# Patient Record
Sex: Male | Born: 1960 | Race: White | Hispanic: No | Marital: Single | Smoking: Current every day smoker
Health system: Southern US, Community
[De-identification: ages and names within clinical notes are randomized; demographics above are authoritative.]

## PROBLEM LIST (undated history)

## (undated) DIAGNOSIS — F3130 Bipolar disorder, current episode depressed, mild or moderate severity, unspecified: Secondary | ICD-10-CM

## (undated) DIAGNOSIS — E119 Type 2 diabetes mellitus without complications: Secondary | ICD-10-CM

## (undated) DIAGNOSIS — R338 Other retention of urine: Secondary | ICD-10-CM

## (undated) DIAGNOSIS — F250 Schizoaffective disorder, bipolar type: Secondary | ICD-10-CM

## (undated) DIAGNOSIS — I7300 Raynaud's syndrome without gangrene: Secondary | ICD-10-CM

## (undated) DIAGNOSIS — M170 Bilateral primary osteoarthritis of knee: Secondary | ICD-10-CM

## (undated) DIAGNOSIS — N401 Enlarged prostate with lower urinary tract symptoms: Secondary | ICD-10-CM

## (undated) DIAGNOSIS — G5603 Carpal tunnel syndrome, bilateral upper limbs: Secondary | ICD-10-CM

## (undated) DIAGNOSIS — E079 Disorder of thyroid, unspecified: Secondary | ICD-10-CM

## (undated) DIAGNOSIS — E669 Obesity, unspecified: Secondary | ICD-10-CM

## (undated) HISTORY — DX: Schizoaffective disorder, bipolar type: F25.0

## (undated) HISTORY — DX: Carpal tunnel syndrome, bilateral upper limbs: G56.03

## (undated) HISTORY — DX: Obesity, unspecified: E66.9

## (undated) HISTORY — DX: Disorder of thyroid, unspecified: E07.9

## (undated) HISTORY — DX: Bipolar disorder, current episode depressed, mild or moderate severity, unspecified: F31.30

## (undated) HISTORY — PX: LEG SURGERY: SHX1003

## (undated) HISTORY — DX: Type 2 diabetes mellitus without complications: E11.9

## (undated) HISTORY — DX: Benign prostatic hyperplasia with lower urinary tract symptoms: N40.1

## (undated) HISTORY — DX: Raynaud's syndrome without gangrene: I73.00

## (undated) HISTORY — DX: Bilateral primary osteoarthritis of knee: M17.0

## (undated) HISTORY — DX: Other retention of urine: R33.8

---

## 2011-01-28 ENCOUNTER — Emergency Department (HOSPITAL_COMMUNITY)
Admission: EM | Admit: 2011-01-28 | Discharge: 2011-01-28 | Disposition: A | Discharge status: Home / Self Care | Payer: No Typology Code available for payment source | Attending: Emergency Medicine | Admitting: Emergency Medicine

## 2011-01-28 ENCOUNTER — Emergency Department (HOSPITAL_COMMUNITY): Payer: No Typology Code available for payment source

## 2011-01-28 DIAGNOSIS — M542 Cervicalgia: Secondary | ICD-10-CM | POA: Insufficient documentation

## 2011-01-28 DIAGNOSIS — M549 Dorsalgia, unspecified: Secondary | ICD-10-CM | POA: Insufficient documentation

## 2011-01-28 DIAGNOSIS — S139XXA Sprain of joints and ligaments of unspecified parts of neck, initial encounter: Secondary | ICD-10-CM | POA: Insufficient documentation

## 2011-01-28 DIAGNOSIS — M546 Pain in thoracic spine: Secondary | ICD-10-CM | POA: Insufficient documentation

## 2011-01-28 DIAGNOSIS — M25519 Pain in unspecified shoulder: Secondary | ICD-10-CM | POA: Insufficient documentation

## 2011-01-28 DIAGNOSIS — IMO0002 Reserved for concepts with insufficient information to code with codable children: Secondary | ICD-10-CM | POA: Insufficient documentation

## 2011-07-28 ENCOUNTER — Inpatient Hospital Stay (HOSPITAL_COMMUNITY)
Admission: RE | Admit: 2011-07-28 | Discharge: 2011-07-28 | Disposition: A | Discharge status: Home / Self Care | Payer: BC Managed Care – PPO | Source: Ambulatory Visit | Attending: Family Medicine | Admitting: Family Medicine

## 2011-10-06 DIAGNOSIS — G5603 Carpal tunnel syndrome, bilateral upper limbs: Secondary | ICD-10-CM

## 2011-10-06 DIAGNOSIS — N401 Enlarged prostate with lower urinary tract symptoms: Secondary | ICD-10-CM

## 2011-10-06 DIAGNOSIS — I7300 Raynaud's syndrome without gangrene: Secondary | ICD-10-CM

## 2011-10-06 HISTORY — DX: Carpal tunnel syndrome, bilateral upper limbs: G56.03

## 2011-10-06 HISTORY — DX: Raynaud's syndrome without gangrene: I73.00

## 2011-10-06 HISTORY — DX: Benign prostatic hyperplasia with lower urinary tract symptoms: N40.1

## 2011-11-30 ENCOUNTER — Encounter (HOSPITAL_COMMUNITY): Payer: Self-pay | Admitting: *Deleted

## 2011-11-30 ENCOUNTER — Emergency Department (HOSPITAL_COMMUNITY): Payer: BC Managed Care – PPO

## 2011-11-30 ENCOUNTER — Emergency Department (HOSPITAL_COMMUNITY)
Admission: EM | Admit: 2011-11-30 | Discharge: 2011-11-30 | Disposition: A | Discharge status: Home / Self Care | Payer: BC Managed Care – PPO | Attending: Emergency Medicine | Admitting: Emergency Medicine

## 2011-11-30 DIAGNOSIS — I7300 Raynaud's syndrome without gangrene: Secondary | ICD-10-CM | POA: Insufficient documentation

## 2011-11-30 DIAGNOSIS — M7989 Other specified soft tissue disorders: Secondary | ICD-10-CM | POA: Insufficient documentation

## 2011-11-30 DIAGNOSIS — M79609 Pain in unspecified limb: Secondary | ICD-10-CM | POA: Insufficient documentation

## 2011-11-30 MED ORDER — CEPHALEXIN 500 MG PO CAPS: 500.0000 mg | ORAL_CAPSULE | Freq: Four times a day (QID) | ORAL | Status: AC

## 2011-11-30 MED ORDER — OXYCODONE-ACETAMINOPHEN 5-325 MG PO TABS: 1.0000 | ORAL_TABLET | Freq: Four times a day (QID) | ORAL | Status: AC | PRN

## 2011-11-30 NOTE — ED Notes (Signed)
Family at bedside. 

## 2011-11-30 NOTE — ED Notes (Signed)
Patient has noted his finger tips are white colored and his hands are swollen for 10 days.  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  Patient states has pain in both hands.  He states he has intermittent color changes.

## 2011-11-30 NOTE — ED Notes (Signed)
C/o bilateral hand edema, tingling, pain x 1 week. Denies injury. States when its cold outside his fingertips turn white in color

## 2011-11-30 NOTE — ED Notes (Signed)
Patient transported to X-ray 

## 2011-11-30 NOTE — ED Provider Notes (Signed)
History     CSN: [id number]  Arrival date & time 11/30/11  1146   First MD Initiated Contact with Patient 11/30/11 1331      Chief Complaint  Patient presents with  . Hand Problem    (Consider location/radiation/quality/duration/timing/severity/associated sxs/prior treatment) HPI Comments: Pain is an 8/10 in bilateral hands. Fingers are red and swollen.  Patient is a 51 y.o. male presenting with hand pain. The history is provided by the patient.  Hand Pain This is a new problem. The current episode started more than 1 week ago. The problem occurs constantly. The problem has been gradually improving. Associated symptoms comments: Over the last 2 weeks bilateral hands have been swollen and red. When they get cold fingertips turn white and as they warm up the hands become red again.. Exacerbated by: Cold-weather. Relieved by: Warming up the hand. He has tried nothing for the symptoms. The treatment provided no relief.    History reviewed. No pertinent past medical history.  Past Surgical History  Procedure Date  . Leg surgery     No family history on file.  History  Substance Use Topics  . Smoking status: Current Everyday Smoker  . Smokeless tobacco: Not on file  . Alcohol Use: Yes      Review of Systems  Constitutional: Negative for fever.  All other systems reviewed and are negative.    Allergies  Review of patient's allergies indicates no known allergies.  Home Medications  No current outpatient prescriptions on file.  BP 129/94  Pulse 81  Temp(Src) 97.9 F (36.6 C) (Oral)  Resp 20  Ht 5\' 9"  (1.753 m)  Wt 205 lb (92.987 kg)  BMI 30.27 kg/m2  SpO2 100%  Physical Exam  Nursing note and vitals reviewed. Constitutional: He is oriented to person, place, and time. He appears well-developed and well-nourished. No distress.  HENT:  Head: Normocephalic and atraumatic.  Mouth/Throat: Oropharynx is clear and moist.  Eyes: Conjunctivae and EOM are normal.  Pupils are equal, round, and reactive to light.  Neck: Normal range of motion. Neck supple.  Cardiovascular: Intact distal pulses.   Musculoskeletal: Normal range of motion. He exhibits no edema and no tenderness.       Left hand: He exhibits tenderness and swelling. He exhibits normal capillary refill.       Hands:      Mild redness and swelling in all fingers on bilateral hands. No warmth. Normal capillary refill. 2+ radial pulses bilaterally.  Neurological: He is alert and oriented to person, place, and time.  Skin: Skin is warm and dry. No rash noted. No erythema.  Psychiatric: He has a normal mood and affect. His behavior is normal.    ED Course  Procedures (including critical care time)  Labs Reviewed - No data to display Dg Hand Complete Right  11/30/2011  *RADIOLOGY REPORT*  Clinical Data: Pain.  Right thumb swelling.  Question foreign body of the thumb and fingers.  RIGHT HAND - COMPLETE 3+ VIEW  Comparison: None.  Findings: There is no evidence for acute fracture or dislocation. No soft tissue foreign body or gas identified.  IMPRESSION: Negative exam.  Original Report Authenticated By: Patterson Hammersmith, M.D.     No diagnosis found.    MDM   Patient with symptoms most consistent with Raynaud's. For the last 2 weeks he's had swelling of bilateral hands when his hands get cold they become white and then as they warm up they become red again. 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 However he states he's never had these symptoms before. On exam his fingers or mildly tender and swollen. The entire finger and hand is swollen not just joints. He has no other joint pain. No fever. He denies working with any material that he would be allergic to and no caustic materials. Patient does have one area over the thumb where it looks mildly infected.  He is unsure if he got steel in his hand or not. Will get a plain film to evaluate her for bodies. Discussed with patient at  length about Raynaud's in the lack of any particular treatment. Will treat his pain and have him wear gloves and other things to cause his hands to be worn at all times. We'll also give him anti-inflammatories. Will have him follow up with her regular doctor if he needs calcium channel blockers in the future. Also counseled about smoke cessation        Gwyneth Sprout, MD 11/30/11 1454

## 2011-11-30 NOTE — Discharge Instructions (Signed)
Raynaud's Syndrome Raynaud's Syndrome is a disorder of the blood vessels in your hands and feet. It occurs when small arteries of the arms/hands or legs/feet become sensitive to cold or emotional upset. This causes the arteries to constrict, or narrow, and reduces blood flow to the area. The color in the fingers or toes changes from white to bluish to red and this is not usually painful. There may be numbness and tingling. Sores on the skin (ulcers) can form. Symptoms are usually relieved by warming. HOME CARE INSTRUCTIONS   Avoid exposure to cold. Keep your whole body warm and dry. Dress in layers. Wear mittens or gloves when handling ice or frozen food and when outdoors. Use holders for glasses or cans containing cold drinks. If possible, stay indoors during cold weather.   Limit your use of caffeine. Switch to decaffeinated coffee, tea, and soda pop. Avoid chocolate.   Avoid smoking or being around cigarette smoke. Smoke will make symptoms worse.   Wear loose fitting socks and comfortable, roomy shoes.   Avoid vibrating tools and machinery.   If possible, avoid stressful and emotional situations. Exercise, meditation and yoga may help you cope with stress. Biofeedback may be useful.   Ask your caregiver about medicine (calcium channel blockers) that may control Raynaud's phenomena.  SEEK MEDICAL CARE IF:   Your discomfort becomes worse, despite conservative treatment.   You develop sores on your fingers and toes that do not heal.  Document Released: 09/18/2000 Document Revised: 06/03/2011 Document Reviewed: 09/25/2008 [facility] Patient Information 2012 [place], [place].  Insufficient Money for Medicine Contact [employer]:  call "211" or [facility] [phone].  No Primary Care Doctor Call [facility]  [phone] Other agencies that provide inexpensive medical care    [facility]  [phone]    [facility]  [phone]    [facility]   [phone]    [facility]  [phone]    [facility]  [phone]    [facility]  [phone]

## 2012-02-04 ENCOUNTER — Ambulatory Visit: Payer: BC Managed Care – PPO | Admitting: Family

## 2012-02-04 DIAGNOSIS — Z0289 Encounter for other administrative examinations: Secondary | ICD-10-CM

## 2012-10-05 DIAGNOSIS — F3130 Bipolar disorder, current episode depressed, mild or moderate severity, unspecified: Secondary | ICD-10-CM

## 2012-10-05 HISTORY — DX: Bipolar disorder, current episode depressed, mild or moderate severity, unspecified: F31.30

## 2013-07-07 IMAGING — CR DG HAND COMPLETE 3+V*R*
3 series · 3 of 3 positions shown · non-contrast
Comparison: None.

CLINICAL DATA: Pain.  Right thumb swelling.  Question foreign body
of the thumb and fingers.

RIGHT HAND - COMPLETE 3+ VIEW

[x hand pa right]
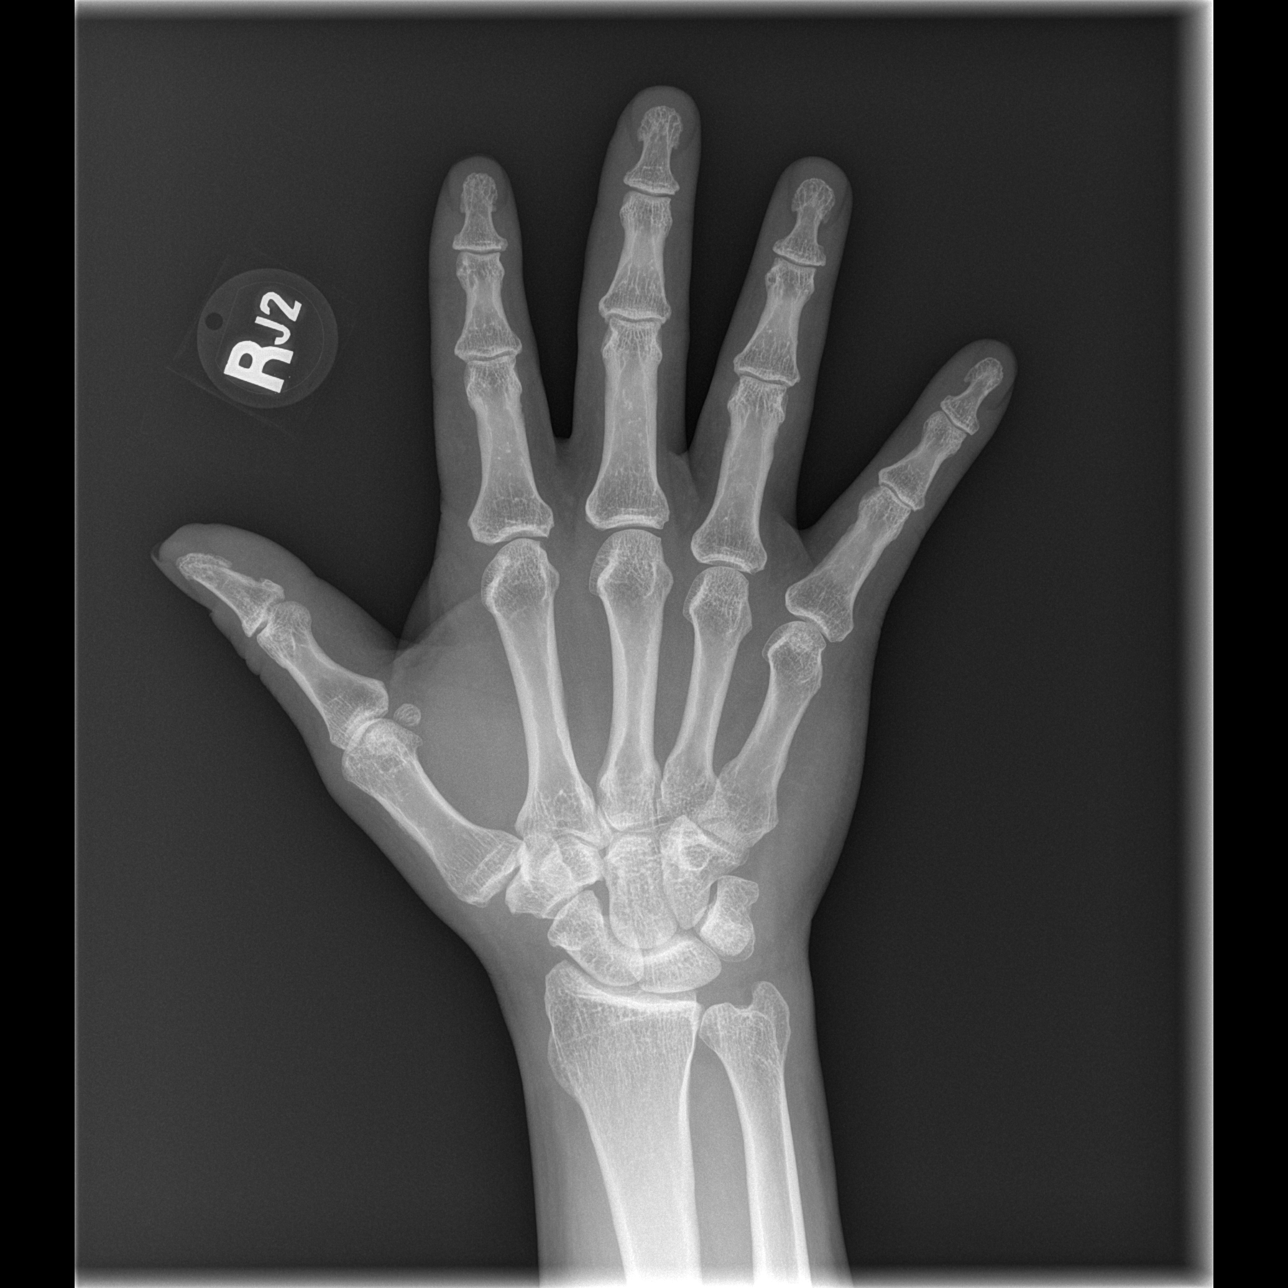

[x hand oblique right]
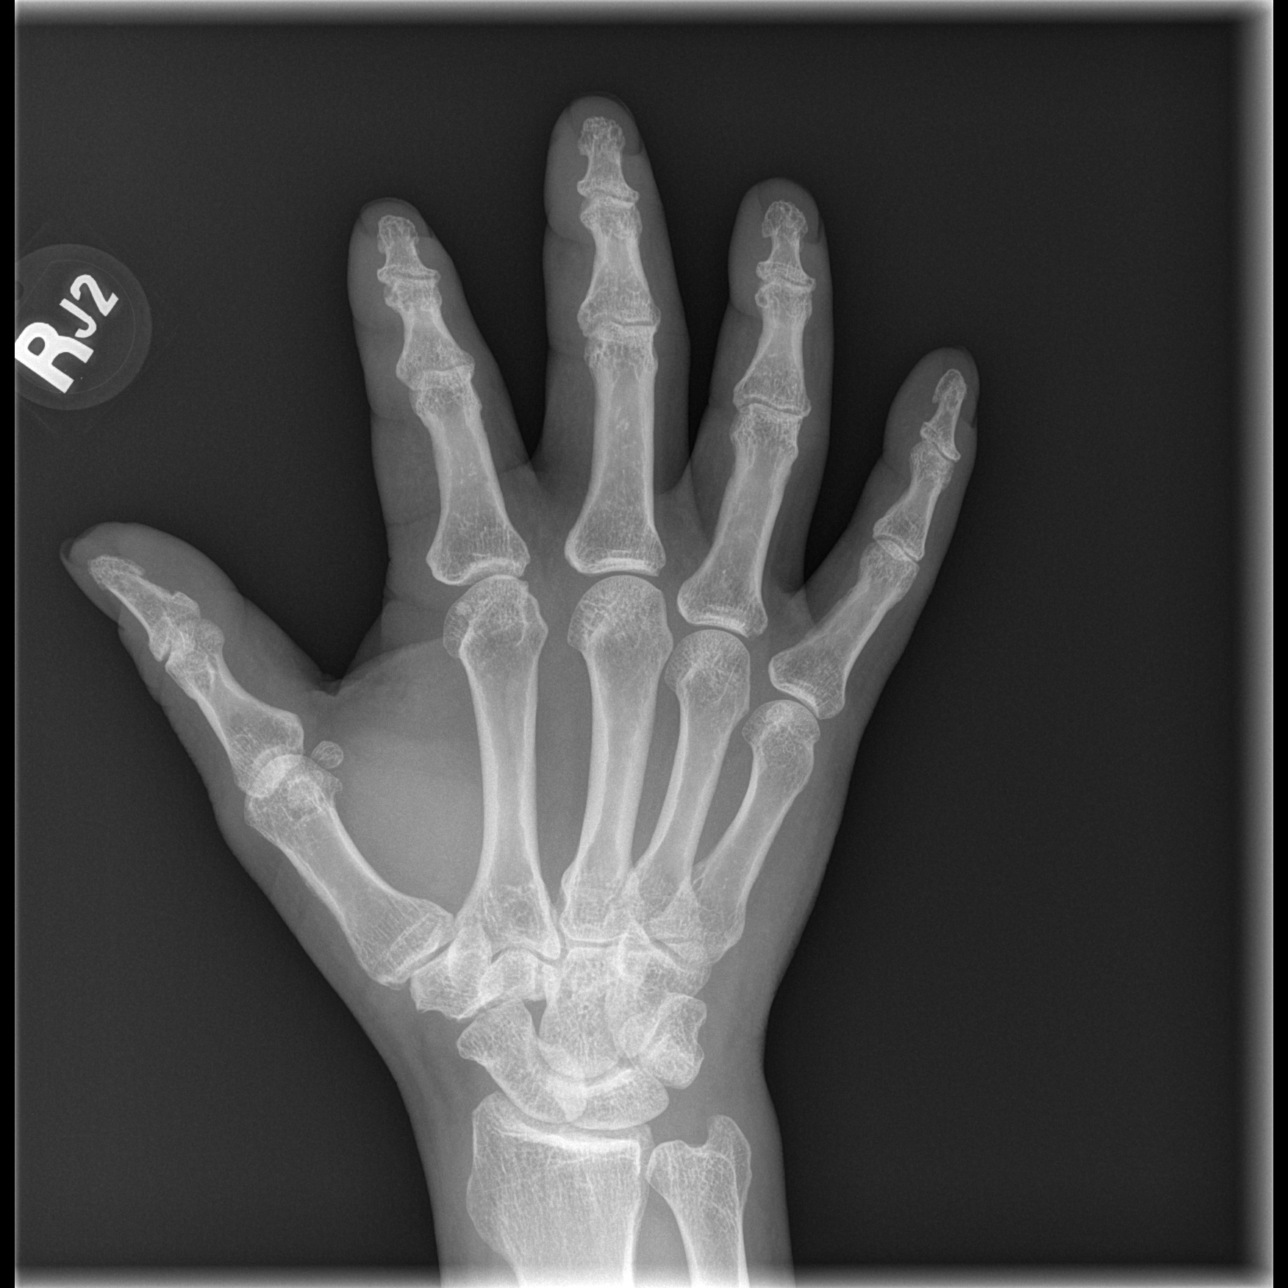

[x hand lat right]
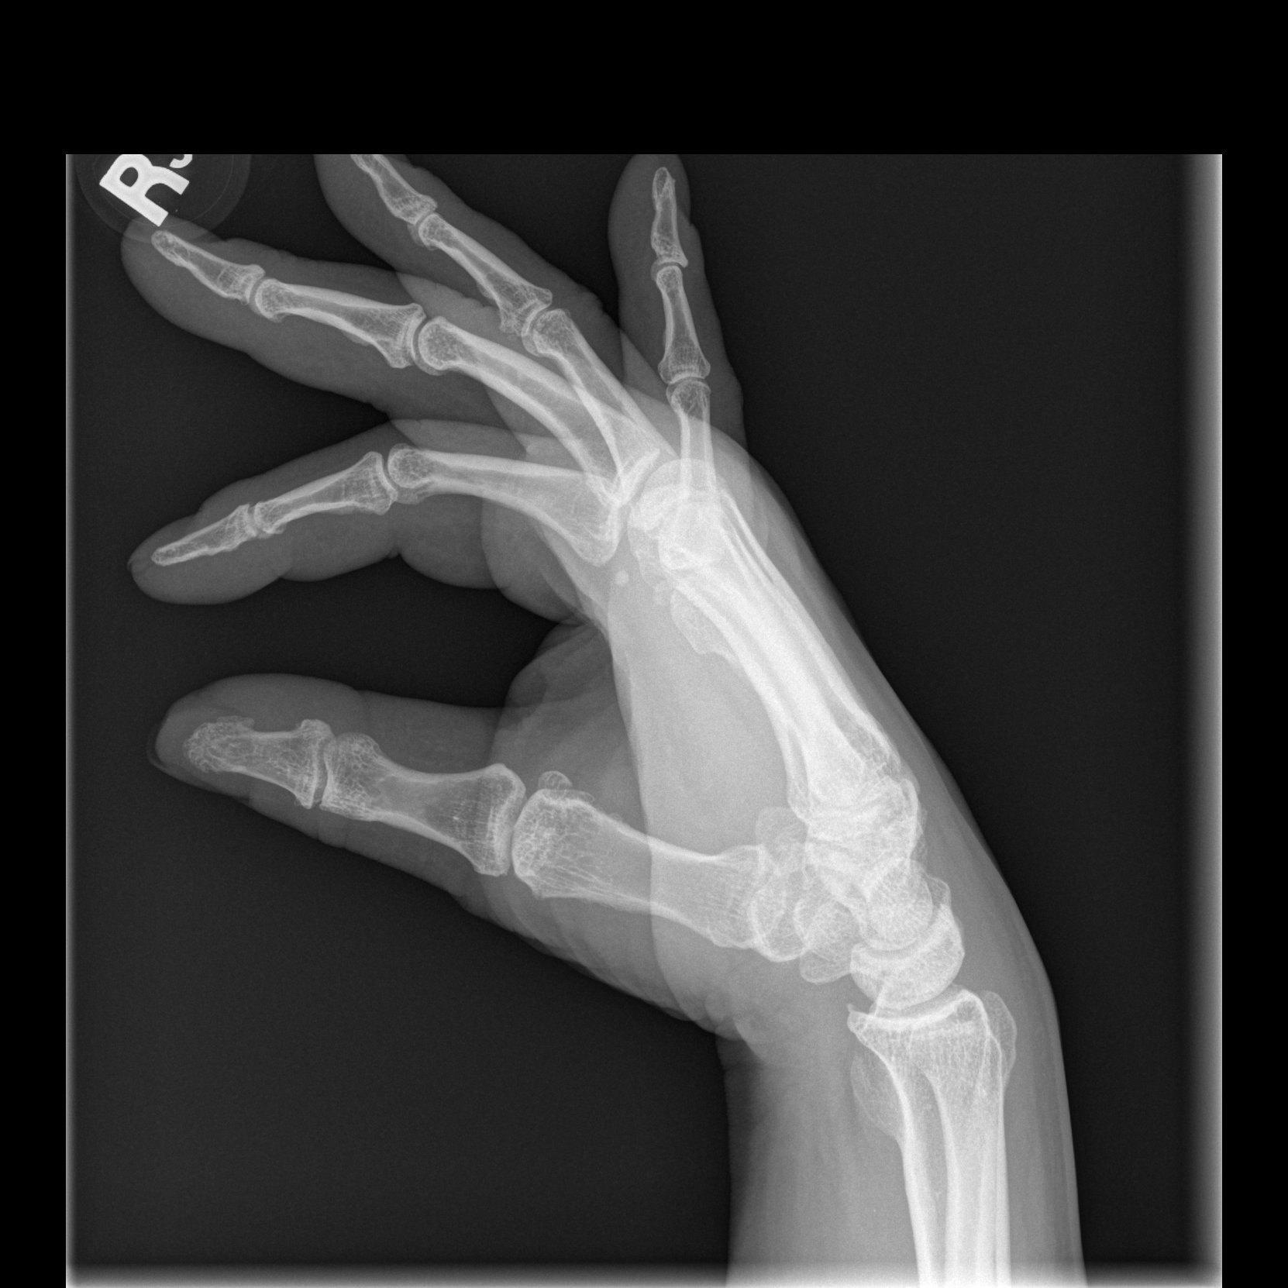

[3 of 3 positions shown; findings below may reference images not displayed]

FINDINGS: There is no evidence for acute fracture or dislocation.
No soft tissue foreign body or gas identified.
IMPRESSION: Negative exam.

## 2013-10-05 DIAGNOSIS — E079 Disorder of thyroid, unspecified: Secondary | ICD-10-CM

## 2013-10-05 HISTORY — DX: Disorder of thyroid, unspecified: E07.9

## 2015-07-01 ENCOUNTER — Emergency Department (INDEPENDENT_AMBULATORY_CARE_PROVIDER_SITE_OTHER)
Admission: EM | Admit: 2015-07-01 | Discharge: 2015-07-01 | Disposition: A | Discharge status: Home / Self Care | Payer: Self-pay | Source: Home / Self Care | Attending: Family Medicine | Admitting: Family Medicine

## 2015-07-01 ENCOUNTER — Emergency Department (INDEPENDENT_AMBULATORY_CARE_PROVIDER_SITE_OTHER): Payer: Self-pay

## 2015-07-01 ENCOUNTER — Encounter (HOSPITAL_COMMUNITY): Payer: Self-pay | Admitting: Emergency Medicine

## 2015-07-01 DIAGNOSIS — S90122A Contusion of left lesser toe(s) without damage to nail, initial encounter: Secondary | ICD-10-CM

## 2015-07-01 MED ORDER — DICLOFENAC POTASSIUM 50 MG PO TABS: 50.0000 mg | ORAL_TABLET | Freq: Three times a day (TID) | ORAL | Status: DC
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
## 2015-07-01 NOTE — Discharge Instructions (Signed)
Buddy Taping of Toes Ice and elevation We have taped your toes together to keep them from moving. This is called "buddy taping" since we used a part of your own body to keep the injured part still. We placed soft padding between your toes to keep them from rubbing against each other. Buddy taping will help with healing and to reduce pain. Keep your toes buddy taped together for as long as directed by your caregiver. HOME CARE INSTRUCTIONS   Raise your injured area above the level of your heart while sitting or lying down. Prop it up with pillows.  An ice pack used every twenty minutes, while awake, for the first one to two days may be helpful. Put ice in a plastic bag and put a towel between the bag and your skin.  Watch for signs that the taping is too tight. These signs may be:  Numbness of your taped toes.  Coolness of your taped toes.  Color change in the area beyond the tape.  Increased pain.  If you have any of these signs, loosen or rewrap the tape. If you need to loosen or rewrap the buddy tape, make sure you use the padding again. SEEK IMMEDIATE MEDICAL CARE IF:   You have worse pain, swelling, inflammation (soreness), drainage or bleeding after you rewrap the tape.  Any new problems occur. MAKE SURE YOU:   Understand these instructions.  Will watch your condition.  Will get help right away if you are not doing well or get worse. Document Released: 06/25/2004 Document Revised: 12/14/2011 Document Reviewed: 09/18/2008 [facility] Patient Information 2015 [place], [place]. This information is not intended to replace advice given to you by your health care provider. Make sure you discuss any questions you have with your health care provider.

## 2015-07-03 ENCOUNTER — Other Ambulatory Visit: Payer: Self-pay | Admitting: Internal Medicine

## 2015-07-08 LAB — ANTINUCLEAR ANTIBODIES, IFA: ANA Titer 1: NEGATIVE

## 2015-07-08 LAB — SPECIMEN STATUS REPORT

## 2015-07-23 ENCOUNTER — Encounter: Payer: Self-pay | Admitting: Internal Medicine

## 2015-07-23 ENCOUNTER — Ambulatory Visit (INDEPENDENT_AMBULATORY_CARE_PROVIDER_SITE_OTHER): Payer: Self-pay | Admitting: Internal Medicine

## 2015-07-23 VITALS — BP 136/88 | HR 84 | Ht 67.0 in | Wt 227.0 lb

## 2015-07-23 DIAGNOSIS — R338 Other retention of urine: Secondary | ICD-10-CM

## 2015-07-23 DIAGNOSIS — F3132 Bipolar disorder, current episode depressed, moderate: Secondary | ICD-10-CM

## 2015-07-23 DIAGNOSIS — M1712 Unilateral primary osteoarthritis, left knee: Secondary | ICD-10-CM

## 2015-07-23 DIAGNOSIS — I7300 Raynaud's syndrome without gangrene: Secondary | ICD-10-CM | POA: Insufficient documentation

## 2015-07-23 DIAGNOSIS — F3130 Bipolar disorder, current episode depressed, mild or moderate severity, unspecified: Secondary | ICD-10-CM | POA: Insufficient documentation

## 2015-07-23 DIAGNOSIS — N401 Enlarged prostate with lower urinary tract symptoms: Secondary | ICD-10-CM | POA: Insufficient documentation

## 2015-07-23 DIAGNOSIS — N400 Enlarged prostate without lower urinary tract symptoms: Secondary | ICD-10-CM

## 2015-07-23 DIAGNOSIS — M129 Arthropathy, unspecified: Secondary | ICD-10-CM

## 2015-07-23 DIAGNOSIS — G5603 Carpal tunnel syndrome, bilateral upper limbs: Secondary | ICD-10-CM | POA: Insufficient documentation

## 2015-07-23 MED ORDER — IBUPROFEN 600 MG PO TABS: 600.0000 mg | ORAL_TABLET | Freq: Four times a day (QID) | ORAL | Status: DC | PRN
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
## 2015-07-23 NOTE — Patient Instructions (Signed)
Will check Anti RNP antibodies next--patient may return today for check Referral for knee xray sent--will need to wait to hear from [facility] for referral.

## 2015-07-24 ENCOUNTER — Other Ambulatory Visit (INDEPENDENT_AMBULATORY_CARE_PROVIDER_SITE_OTHER): Payer: Self-pay

## 2015-07-24 DIAGNOSIS — I7300 Raynaud's syndrome without gangrene: Secondary | ICD-10-CM

## 2015-07-25 LAB — ANTIEXTRACTABLE NUCLEAR AG
ENA RNP Ab: 0.9 AI (ref 0.0–0.9)
ENA SM Ab Ser-aCnc: <0.2 AI (ref 0.0–0.9)

## 2015-08-26 ENCOUNTER — Telehealth: Payer: Self-pay | Admitting: Internal Medicine

## 2015-08-26 NOTE — Telephone Encounter (Signed)
Called Olegario MessierKathy and confirmed I want pt. To stay on Tamsulosin with the Avodart for at least the first 6 months until the Avodart has a chance to work.

## 2015-08-26 NOTE — Telephone Encounter (Signed)
°  [facility] called to inquire is patient can continue to take Avodart w/Tamsulosion.  Please call Olegario MessierKathy in pharmacy at [phone] with direction.

## 2015-09-03 ENCOUNTER — Telehealth: Payer: Self-pay | Admitting: Internal Medicine

## 2015-09-03 NOTE — Telephone Encounter (Signed)
Fax from [facility].  They will no longer carry Amitriptyline.  His knee apparently very swollen when at the pharmacy.   Wondering if he could be switched to Cymbalta. Called and left message that we would need to see him first before changing to Cymbalta. Please call patient and let him know would like to make an appt. To discuss this medication change in next couple of weeks if possible

## 2015-09-06 ENCOUNTER — Ambulatory Visit: Payer: Self-pay | Admitting: Internal Medicine

## 2015-09-10 ENCOUNTER — Ambulatory Visit (INDEPENDENT_AMBULATORY_CARE_PROVIDER_SITE_OTHER): Payer: Self-pay | Admitting: Internal Medicine

## 2015-09-10 VITALS — BP 126/82 | HR 96 | Resp 16 | Ht 67.0 in | Wt 217.0 lb

## 2015-09-10 DIAGNOSIS — M1990 Unspecified osteoarthritis, unspecified site: Secondary | ICD-10-CM

## 2015-09-10 DIAGNOSIS — M129 Arthropathy, unspecified: Secondary | ICD-10-CM

## 2015-09-10 DIAGNOSIS — M25562 Pain in left knee: Secondary | ICD-10-CM

## 2015-09-10 DIAGNOSIS — I7300 Raynaud's syndrome without gangrene: Secondary | ICD-10-CM
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
## 2015-09-12 NOTE — Telephone Encounter (Signed)
Patient was seen on 09/10/15 @ 3:00 pm. Medications were discussed at appointment

## 2015-12-08 ENCOUNTER — Encounter: Payer: Self-pay | Admitting: Internal Medicine

## 2016-01-07 ENCOUNTER — Ambulatory Visit: Payer: Self-pay | Admitting: Internal Medicine

## 2016-02-12 ENCOUNTER — Telehealth: Payer: Self-pay | Admitting: Internal Medicine

## 2016-02-12 NOTE — Telephone Encounter (Signed)
Oleg[facility]athy from [facility] left a voice message stating patient has been prescribed buPROPion (WELLBUTRIN XL) 300 MG 24 hr tablet but pharmacy does not carry medication there. Patient was previously on SR 2 times a day and would like to get back on that since the [facility] does carry that medicine there. Please send Rx . If any questions please call Olegario MessierKathy at [phone]

## 2016-02-13 NOTE — Telephone Encounter (Signed)
Is [facility] no longer going to [facility]?  He was on more than one med with them and he was getting this med filled through them.   I have not written previously. Please check with him as to what changes have occurred.

## 2016-02-19 NOTE — Telephone Encounter (Signed)
Called patient. Unable to leave voice message to return call, line keeps on ringing and no answer. I will try again later

## 2016-02-28 NOTE — Telephone Encounter (Signed)
Unable to reach patient.

## 2017-02-05 IMAGING — DX DG FOOT COMPLETE 3+V*L*
3 series · 3 of 3 positions shown · non-contrast
Comparison: None.

CLINICAL DATA: A card at [employer] rolled over the patient's foot.
Small toe is bruised and swollen.

EXAM:
LEFT FOOT - COMPLETE 3+ VIEW

[foot ap]
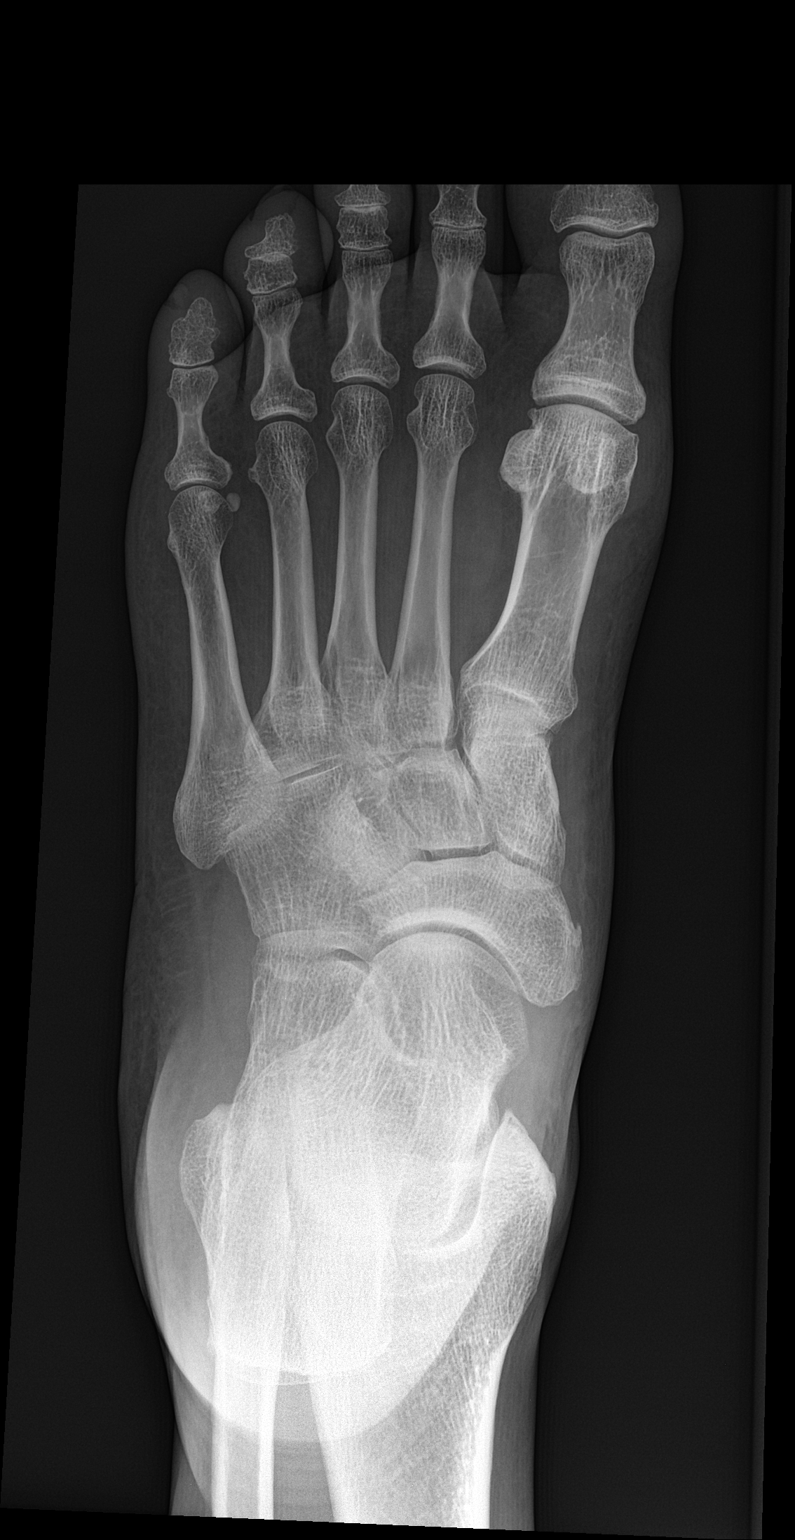

[foot obl]
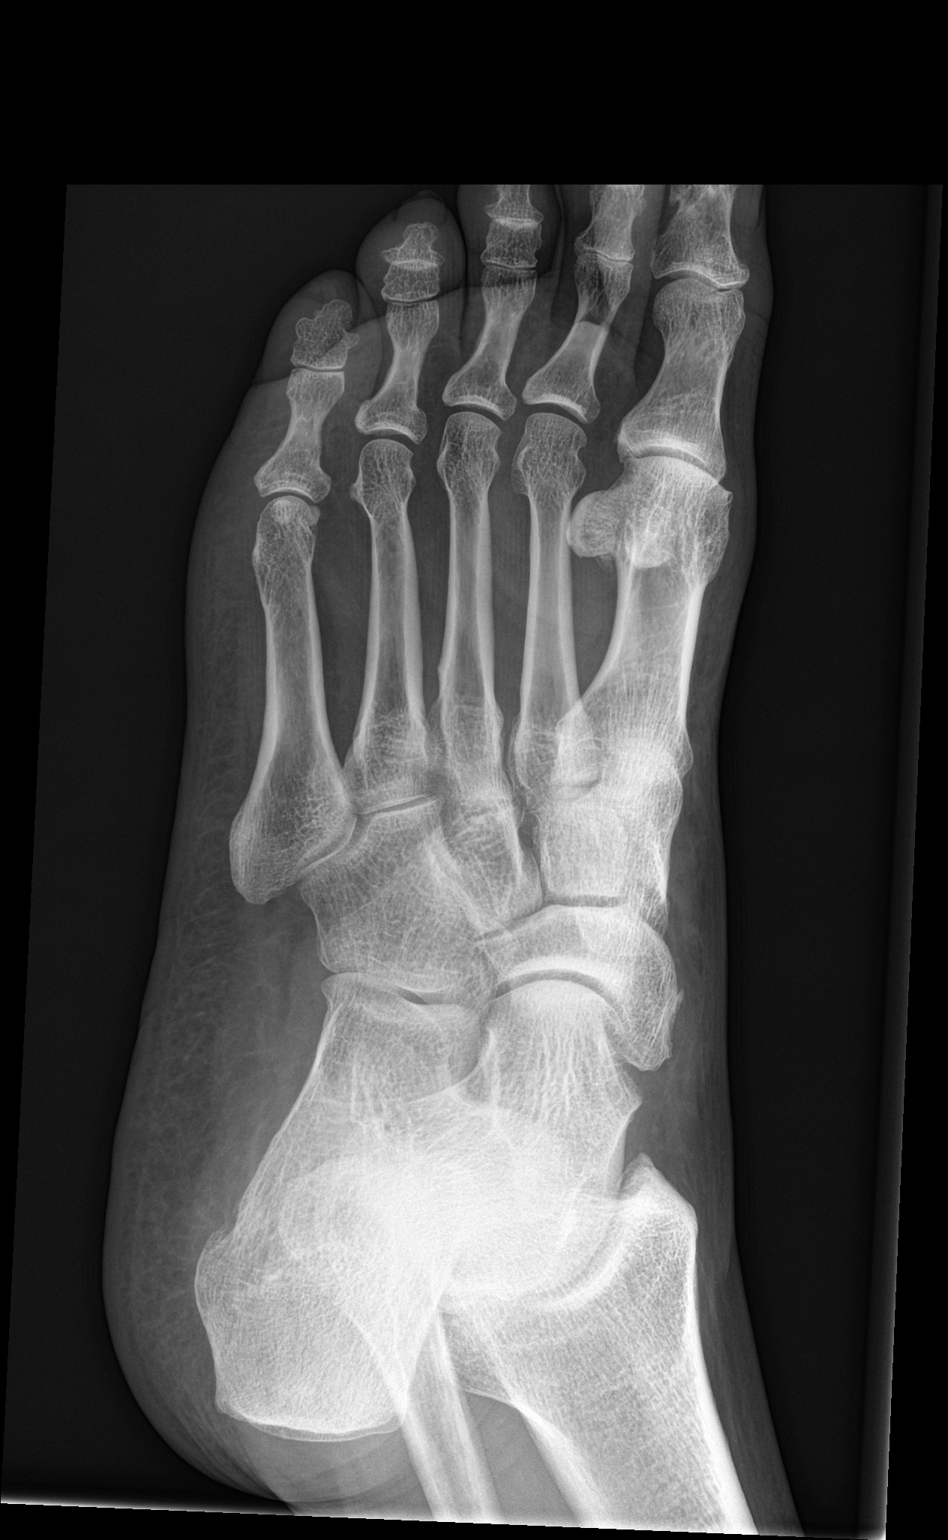

[foot lat]
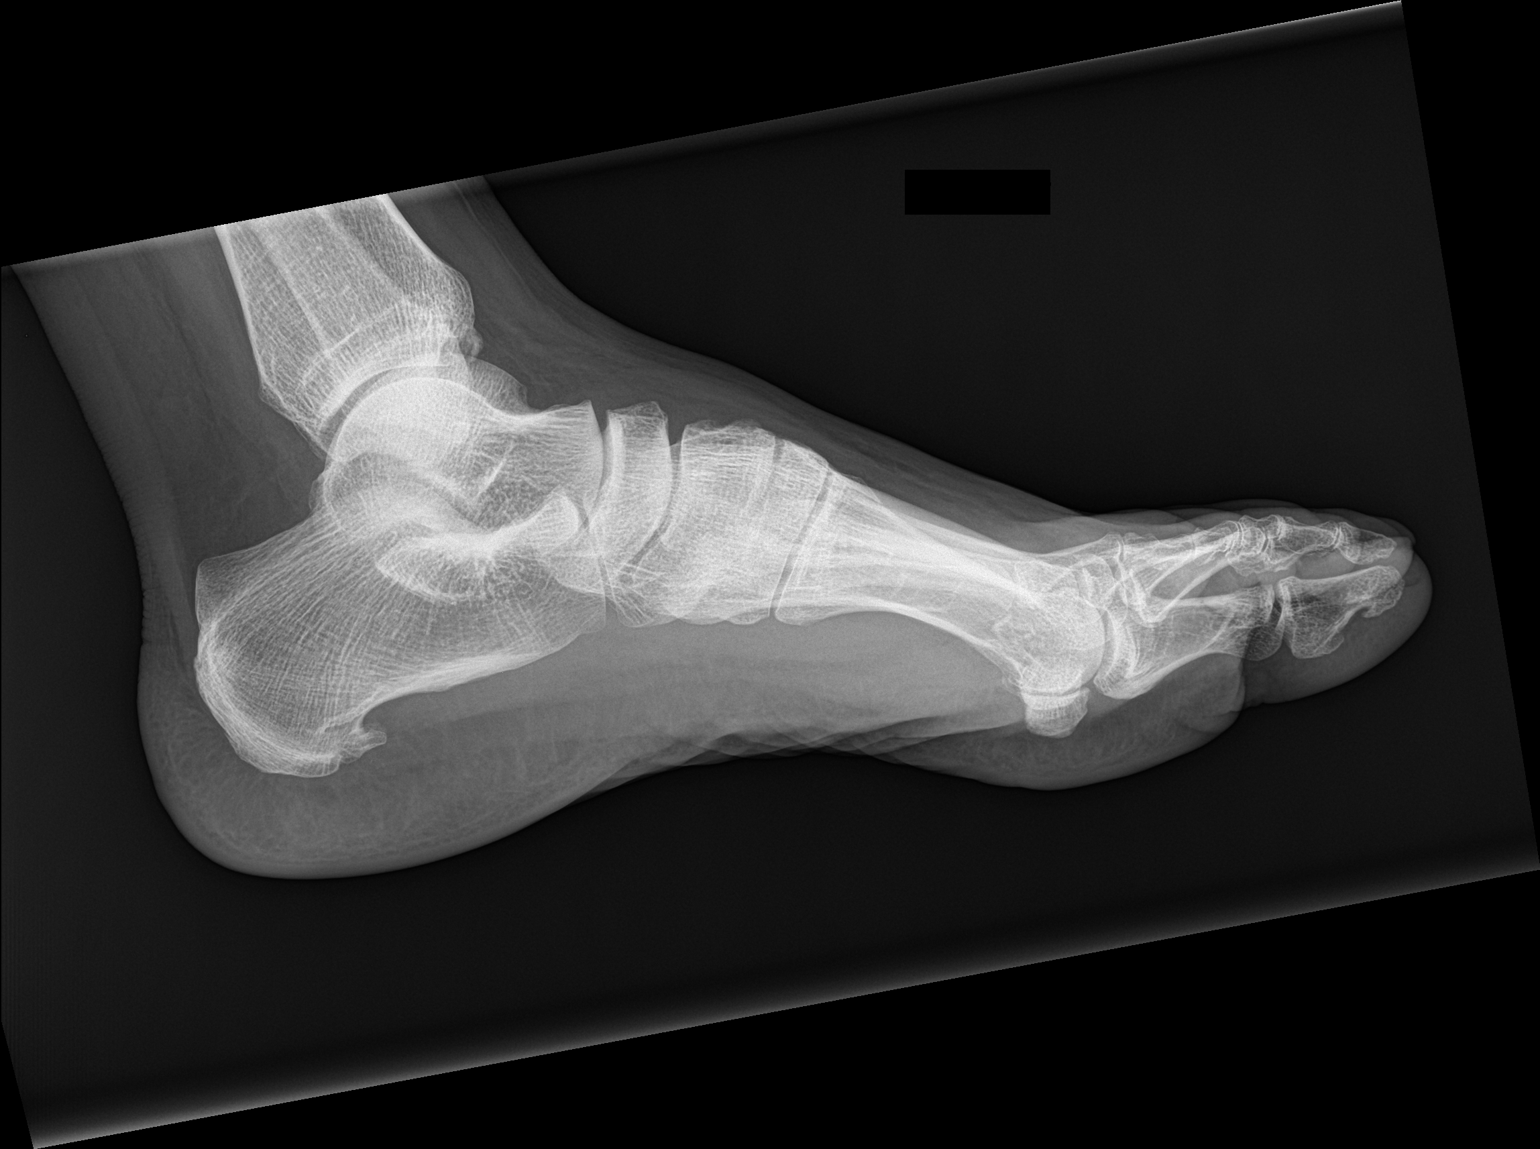

[3 of 3 positions shown; findings below may reference images not displayed]

FINDINGS: Degenerative loss of articular space in the interphalangeal joints.
No fracture observed.

Lisfranc joint alignment normal. There does appear to be some
swelling of the small toe. Base of fifth metatarsal intact.

Moderate-sized plantar calcaneal spur.
IMPRESSION: 1. No fracture of the small toe is identified. There is some soft
tissue swelling in this vicinity.
2. Osteoarthritis.
3. Plantar calcaneal spur.

## 2017-05-11 ENCOUNTER — Encounter: Payer: Self-pay | Admitting: Internal Medicine

## 2017-05-11 ENCOUNTER — Ambulatory Visit (INDEPENDENT_AMBULATORY_CARE_PROVIDER_SITE_OTHER): Payer: Self-pay | Admitting: Internal Medicine

## 2017-05-11 VITALS — BP 130/82 | HR 86 | Resp 16 | Ht 67.0 in | Wt 256.0 lb

## 2017-05-11 DIAGNOSIS — Z6841 Body Mass Index (BMI) 40.0 and over, adult: Secondary | ICD-10-CM

## 2017-05-11 DIAGNOSIS — E119 Type 2 diabetes mellitus without complications: Secondary | ICD-10-CM

## 2017-05-11 DIAGNOSIS — R338 Other retention of urine: Secondary | ICD-10-CM

## 2017-05-11 DIAGNOSIS — N401 Enlarged prostate with lower urinary tract symptoms: Secondary | ICD-10-CM

## 2017-05-11 DIAGNOSIS — E669 Obesity, unspecified: Secondary | ICD-10-CM

## 2017-05-11 DIAGNOSIS — M170 Bilateral primary osteoarthritis of knee: Secondary | ICD-10-CM | POA: Insufficient documentation

## 2017-05-11 DIAGNOSIS — F250 Schizoaffective disorder, bipolar type: Secondary | ICD-10-CM

## 2017-05-11 DIAGNOSIS — E661 Drug-induced obesity: Secondary | ICD-10-CM

## 2017-05-11 DIAGNOSIS — I7300 Raynaud's syndrome without gangrene: Secondary | ICD-10-CM

## 2017-05-11 DIAGNOSIS — G5603 Carpal tunnel syndrome, bilateral upper limbs: Secondary | ICD-10-CM

## 2017-05-11 HISTORY — DX: Obesity, unspecified: E66.9

## 2017-05-11 HISTORY — DX: Bilateral primary osteoarthritis of knee: M17.0

## 2017-05-11 HISTORY — DX: Schizoaffective disorder, bipolar type: F25.0

## 2017-05-11 HISTORY — DX: Type 2 diabetes mellitus without complications: E11.9

## 2017-05-11 MED ORDER — MELOXICAM 7.5 MG PO TABS: 7.5000 mg | ORAL_TABLET | Freq: Two times a day (BID) | ORAL | 6 refills | Status: DC

## 2017-05-11 MED ORDER — DUTASTERIDE 0.5 MG PO CAPS: 0.5000 mg | ORAL_CAPSULE | Freq: Every day | ORAL | 11 refills | Status: DC

## 2017-05-11 NOTE — Progress Notes (Signed)
Subjective:    Patient ID: Paul Ho, male    DOB: 1960/10/14, 56 y.o.   MRN: [id number]  HPI   Has been lost to follow up since 09/2015.  Patient states he made bad decisions and was in prison for 13 months.    1.  Bilateral carpal tunnel:  Underwent EMG of bilateral arms and found to have abnormalities consistent with moderate CTS bilaterally.  He is using cock up splint mainly at night, but also sometimes in the day.  He does have chronic numbness in his right hand and is right handed.  Unable to open bottles, etc.  Easily.    2.  Raynaud's phenomenon:  Taking Amlodipine 10 mg daily and states that has helped the winter months, but not during winter.    3.  Mental Health: Not clear what his diagnosis is, sounds like schizoaffective disorder and borderline personality disorder.  Patient states he was hearing voices telling him to do things and not do things.  That is essentially resolved.  Was on Remeron for 6 months and picked up 40 lbs.  Developed DM with this weight gain.  Is being followed at [facility].  His therapist is Rodney Booze.  Does not have her specific contact information.  Phone at [facility] is [phone]. Taking Amitriptyline 25 mg for sleep and pain. Cogentin 1 mg Fluoxetine 20 mg daily Haloperidol   2 mg daily Hydroxyzine 25 mg daily. Trazodone 150 mg daily.  4.  DM:  Diagnosed after 40 lb weight gain in 2.2018.  Only taking glipizide.  Sugars 140-160.  Checks both in the morning before meal and frequently before or after evening meal.  Last A1c was 7.5 % in January of 2018. This was at [facility] where he was being held.  Released June 23rd of this year. Had eye exam in May at [facility]:  No diabetic changes per patient.  5.  Hypothyroidism:  Taking Levothyroxine 125 mcg and told was adequately replaced.    6.  Chronic pain in knees and feet:   Is out of Meloxicam for 1 week and notes the difference in pain control.  Would like to refill.  7.  BPH:  On  flomax.  Not taking finasteride, but states his legs swelled up, so stopped the finasteride ultimately and swelling went away.  Was on Avodart previously was well tolerated.  8.  Obesity:  Gained 40 lbs, but lost 10 lbs since June since out of prison.  Current Meds  Medication Sig  . amitriptyline (ELAVIL) 25 MG tablet Take 25 mg by mouth at bedtime.  Marland Kitchen amLODipine (NORVASC) 10 MG tablet Take 10 mg by mouth daily.  . benztropine (COGENTIN) 1 MG tablet Take 1 mg by mouth at bedtime.  . finasteride (PROSCAR) 5 MG tablet Take 5 mg by mouth daily.  Marland Kitchen FLUoxetine (PROZAC) 20 MG capsule Take 20 mg by mouth daily.  Marland Kitchen glipiZIDE (GLUCOTROL XL) 5 MG 24 hr tablet Take 5 mg by mouth daily with breakfast.  . haloperidol (HALDOL) 2 MG tablet Take 2 mg by mouth at bedtime.  . hydrOXYzine (VISTARIL) 25 MG capsule Take 25 mg by mouth 2 (two) times daily.   Marland Kitchen levothyroxine (SYNTHROID, LEVOTHROID) 125 MCG tablet Take 25 mcg by mouth daily before breakfast.   . meloxicam (MOBIC) 7.5 MG tablet Take 7.5 mg by mouth 2 (two) times daily.  . tamsulosin (FLOMAX) 0.4 MG CAPS capsule Take 0.4 mg by mouth.  . traZODone (DESYREL) 150 MG tablet  Take by mouth at bedtime.    Allergies  Allergen Reactions  . Tegretol [Carbamazepine] Rash    .   Review of Systems     Objective:   Physical Exam  NAD HEENT:  PERRL, EOMI, discs sharp, TMs pearly gray, throat without injection. Neck:  Supple, No adenopathy, no thyromegaly Chest:  CTA CV:  RRR with normal S1 and S2, No S3, S4 or murmur.  Radial and DP pulses normal and equal.  No LE edema Abd:  S, NT, No HSM or mass, + BS Knees:  No palpable effusion. Neuro:  + Tinels and Phalens at volar wrists bilaterally        Assessment & Plan:  1.  Obesity:  Encouraged work on improving diet and increasing daily physical activity  2.  DM:  To return for fasting labs in next 2 days:  FLP, A1C, CMP, urine microalbumin/crea.  No change to meds at this time.  3.   Hypothyroidism:  TSH with fasting labs.  4.  Carpal Tunnel Syndrome:  Has not responded to conservative management.  Ref[facility].  Continue with splints and Meloxicam.  5.  Knee and foot pain:  Meloxicam refilled  6.  BPH:  Restart Avodart.  [facility].  Try [facility], if too expensive, to call.  Would like him to wean off Amitriptyline as well as may have some urinary retention from that as well as dry mouth.  7.  Raynaud's Phenomenon:  Continue Amlodipine.  Follow up in 2 months. Recommend flu vaccine with clinics in September

## 2017-05-11 NOTE — Patient Instructions (Signed)
Wean amitriptyline gradually as discussed.   Will discuss Gabapentin if you are off in follow up

## 2017-05-13 ENCOUNTER — Other Ambulatory Visit: Payer: Self-pay

## 2017-05-13 DIAGNOSIS — E119 Type 2 diabetes mellitus without complications: Secondary | ICD-10-CM

## 2017-05-13 DIAGNOSIS — Z13220 Encounter for screening for lipoid disorders: Secondary | ICD-10-CM

## 2017-05-13 DIAGNOSIS — E010 Iodine-deficiency related diffuse (endemic) goiter: Secondary | ICD-10-CM

## 2017-05-13 DIAGNOSIS — Z79899 Other long term (current) drug therapy: Secondary | ICD-10-CM

## 2017-05-14 LAB — COMPREHENSIVE METABOLIC PANEL
ALT: 51 IU/L — AB (ref 0–44)
AST: 28 IU/L (ref 0–40)
Albumin/Globulin Ratio: 1.4 (ref 1.2–2.2)
Albumin: 4.2 g/dL (ref 3.5–5.5)
Alkaline Phosphatase: 85 IU/L (ref 39–117)
BUN/Creatinine Ratio: 12 (ref 9–20)
BUN: 11 mg/dL (ref 6–24)
Bilirubin Total: 0.2 mg/dL (ref 0.0–1.2)
CALCIUM: 9.2 mg/dL (ref 8.7–10.2)
CO2: 23 mmol/L (ref 20–29)
Chloride: 103 mmol/L (ref 96–106)
Creatinine, Ser: 0.94 mg/dL (ref 0.76–1.27)
GFR, EST AFRICAN AMERICAN: 104 mL/min/{1.73_m2} (ref >59)
GFR, EST NON AFRICAN AMERICAN: 90 mL/min/{1.73_m2} (ref >59)
GLUCOSE: 102 mg/dL — AB (ref 65–99)
Globulin, Total: 3 g/dL (ref 1.5–4.5)
Potassium: 4.5 mmol/L (ref 3.5–5.2)
Sodium: 141 mmol/L (ref 134–144)
TOTAL PROTEIN: 7.2 g/dL (ref 6.0–8.5)

## 2017-05-14 LAB — TSH: TSH: 6.42 u[IU]/mL — ABNORMAL HIGH (ref 0.450–4.500)

## 2017-05-14 LAB — CBC WITH DIFFERENTIAL/PLATELET
BASOS: 1 %
Basophils Absolute: 0 10*3/uL (ref 0.0–0.2)
EOS (ABSOLUTE): 0.2 10*3/uL (ref 0.0–0.4)
Eos: 2 %
HEMOGLOBIN: 15.1 g/dL (ref 13.0–17.7)
Hematocrit: 46.5 % (ref 37.5–51.0)
IMMATURE GRANS (ABS): 0 10*3/uL (ref 0.0–0.1)
IMMATURE GRANULOCYTES: 0 %
LYMPHS: 36 %
Lymphocytes Absolute: 2.3 10*3/uL (ref 0.7–3.1)
MCH: 26.4 pg — AB (ref 26.6–33.0)
MCHC: 32.5 g/dL (ref 31.5–35.7)
MCV: 81 fL (ref 79–97)
MONOCYTES: 15 %
Monocytes Absolute: 1 10*3/uL — ABNORMAL HIGH (ref 0.1–0.9)
NEUTROS ABS: 3.1 10*3/uL (ref 1.4–7.0)
NEUTROS PCT: 46 %
PLATELETS: 330 10*3/uL (ref 150–379)
RBC: 5.73 x10E6/uL (ref 4.14–5.80)
RDW: 16.8 % — ABNORMAL HIGH (ref 12.3–15.4)
WBC: 6.6 10*3/uL (ref 3.4–10.8)

## 2017-05-14 LAB — LIPID PANEL W/O CHOL/HDL RATIO
CHOLESTEROL TOTAL: 240 mg/dL — AB (ref 100–199)
HDL: 63 mg/dL (ref >39)
LDL Calculated: 133 mg/dL — ABNORMAL HIGH (ref 0–99)
Triglycerides: 222 mg/dL — ABNORMAL HIGH (ref 0–149)
VLDL Cholesterol Cal: 44 mg/dL — ABNORMAL HIGH (ref 5–40)

## 2017-05-14 LAB — HGB A1C W/O EAG: Hgb A1c MFr Bld: 6.7 % — ABNORMAL HIGH (ref 4.8–5.6)

## 2017-05-15 MED ORDER — ATORVASTATIN CALCIUM 40 MG PO TABS: 40.0000 mg | ORAL_TABLET | Freq: Every day | ORAL | 11 refills | Status: DC

## 2017-05-17 ENCOUNTER — Ambulatory Visit: Payer: Self-pay | Admitting: Internal Medicine

## 2017-06-08 ENCOUNTER — Telehealth: Payer: Self-pay
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
## 2017-06-09 ENCOUNTER — Other Ambulatory Visit: Payer: Self-pay

## 2017-06-09 MED ORDER — AMITRIPTYLINE HCL 25 MG PO TABS: 25.0000 mg | ORAL_TABLET | Freq: Every day | ORAL | 11 refills | Status: DC

## 2017-06-09 MED ORDER — MELOXICAM 7.5 MG PO TABS: 7.5000 mg | ORAL_TABLET | Freq: Two times a day (BID) | ORAL | 6 refills | Status: DC

## 2017-06-09 MED ORDER — LEVOTHYROXINE SODIUM 125 MCG PO TABS: 25.0000 ug | ORAL_TABLET | Freq: Every day | ORAL | 11 refills | Status: DC

## 2017-06-09 MED ORDER — AMLODIPINE BESYLATE 10 MG PO TABS: 10.0000 mg | ORAL_TABLET | Freq: Every day | ORAL | 11 refills | Status: DC

## 2017-06-09 MED ORDER — TAMSULOSIN HCL 0.4 MG PO CAPS: 0.4000 mg | ORAL_CAPSULE | Freq: Every day | ORAL | 11 refills | Status: DC

## 2017-06-09 MED ORDER — GLIPIZIDE ER 5 MG PO TB24: 5.0000 mg | ORAL_TABLET | Freq: Every day | ORAL | 11 refills | Status: DC

## 2017-06-09 NOTE — Telephone Encounter (Signed)
Spoke with patient and pharmacy. Authorized refills. Patient on his way to pharmacy. Rx's [facility] to [facility] on [address]. Patient has not filled in Rx's at the health department yet due to him not having his orange card at this time.

## 2017-07-13 ENCOUNTER — Other Ambulatory Visit: Payer: Self-pay

## 2017-07-13 ENCOUNTER — Telehealth: Payer: Self-pay | Admitting: Internal Medicine

## 2017-07-13 MED ORDER — AMLODIPINE BESYLATE 10 MG PO TABS: 10.0000 mg | ORAL_TABLET | Freq: Every day | ORAL | 11 refills | Status: DC

## 2017-07-13 MED ORDER — AMITRIPTYLINE HCL 25 MG PO TABS: 25.0000 mg | ORAL_TABLET | Freq: Every day | ORAL | 11 refills | Status: DC

## 2017-07-13 MED ORDER — LEVOTHYROXINE SODIUM 125 MCG PO TABS: ORAL_TABLET | ORAL | 11 refills | Status: DC

## 2017-07-13 MED ORDER — DUTASTERIDE 0.5 MG PO CAPS: 0.5000 mg | ORAL_CAPSULE | Freq: Every day | ORAL | 11 refills | Status: DC

## 2017-07-13 MED ORDER — MELOXICAM 7.5 MG PO TABS: 7.5000 mg | ORAL_TABLET | Freq: Two times a day (BID) | ORAL | 6 refills | Status: DC

## 2017-07-13 MED ORDER — TAMSULOSIN HCL 0.4 MG PO CAPS: 0.4000 mg | ORAL_CAPSULE | Freq: Every day | ORAL | 11 refills | Status: DC

## 2017-07-13 MED ORDER — ATORVASTATIN CALCIUM 40 MG PO TABS: 40.0000 mg | ORAL_TABLET | Freq: Every day | ORAL | 11 refills | Status: DC

## 2017-07-13 MED ORDER — GLIPIZIDE ER 5 MG PO TB24: 5.0000 mg | ORAL_TABLET | Freq: Every day | ORAL | 11 refills | Status: DC

## 2017-07-13 NOTE — Telephone Encounter (Signed)
Patient called and needs all Rx refills.  Patient stated he now has [employer].  Will bring card by for scanning.  Patient would like Rx called into [facility] Department.

## 2017-07-13 NOTE — Telephone Encounter (Signed)
Rx's faxed to [facility]. Called patient and line is ringing busy x 3 times

## 2017-08-23 ENCOUNTER — Ambulatory Visit (INDEPENDENT_AMBULATORY_CARE_PROVIDER_SITE_OTHER): Payer: Self-pay | Admitting: Internal Medicine

## 2017-08-23 ENCOUNTER — Encounter: Payer: Self-pay | Admitting: Internal Medicine

## 2017-08-23 VITALS — BP 130/80 | HR 90 | Resp 14 | Ht 67.0 in | Wt 241.0 lb

## 2017-08-23 DIAGNOSIS — H547 Unspecified visual loss: Secondary | ICD-10-CM

## 2017-08-23 DIAGNOSIS — Z23 Encounter for immunization: Secondary | ICD-10-CM

## 2017-08-23 DIAGNOSIS — R338 Other retention of urine: Secondary | ICD-10-CM

## 2017-08-23 DIAGNOSIS — N401 Enlarged prostate with lower urinary tract symptoms: Secondary | ICD-10-CM

## 2017-08-23 DIAGNOSIS — K029 Dental caries, unspecified: Secondary | ICD-10-CM

## 2017-08-23 DIAGNOSIS — G8929 Other chronic pain: Secondary | ICD-10-CM

## 2017-08-23 DIAGNOSIS — M25562 Pain in left knee: Secondary | ICD-10-CM

## 2017-08-23 DIAGNOSIS — M25561 Pain in right knee: Secondary | ICD-10-CM

## 2017-08-23 MED ORDER — DUTASTERIDE 0.5 MG PO CAPS: 0.5000 mg | ORAL_CAPSULE | Freq: Every day | ORAL | 11 refills | Status: DC

## 2017-08-23 MED ORDER — TAMSULOSIN HCL 0.4 MG PO CAPS: 0.8000 mg | ORAL_CAPSULE | Freq: Every day | ORAL | 11 refills | Status: AC

## 2017-08-23 NOTE — Progress Notes (Signed)
   Subjective:    Patient ID: Paul Ho, male    DOB: 1960/11/15, 56 y.o.   MRN: [id number]  HPI   1.  Bilateral knee pain:  Generally after sitting for any period of time.  In particular, pull through right knee.     3.  BPH:  States not sure what happened with his Flomax.  States he has been taking 0.8 mg of Flomax for years.  Someone has written into chart as just the 0.4 mg.  He is having a slower stream and more dribbling.  He is now having nocturia x3 .  Did not get Avodart as prescribed some time ago  4.  Decreased visual acuity:  Would like eye referral  5.  Dental decay:  Would like dental referral.  Current Meds  Medication Sig  . amLODipine (NORVASC) 10 MG tablet Take 1 tablet (10 mg total) by mouth daily.  Marland Kitchen. atorvastatin (LIPITOR) 40 MG tablet Take 1 tablet (40 mg total) by mouth daily.  . benztropine (COGENTIN) 1 MG tablet Take 1 mg by mouth at bedtime.  Marland Kitchen. FLUoxetine (PROZAC) 20 MG capsule Take 20 mg by mouth daily.  Marland Kitchen. glipiZIDE (GLUCOTROL XL) 5 MG 24 hr tablet Take 1 tablet (5 mg total) by mouth daily with breakfast.  . haloperidol (HALDOL) 2 MG tablet Take 2 mg by mouth at bedtime.  . hydrOXYzine (VISTARIL) 25 MG capsule Take 25 mg by mouth 2 (two) times daily.   Marland Kitchen. levothyroxine (SYNTHROID, LEVOTHROID) 125 MCG tablet Take 1/2 tablet (62.5 mcg) daily before breakfast  . meloxicam (MOBIC) 7.5 MG tablet Take 1 tablet (7.5 mg total) by mouth 2 (two) times daily.  . tamsulosin (FLOMAX) 0.4 MG CAPS capsule Take 2 capsules (0.8 mg total) daily by mouth.  . traZODone (DESYREL) 150 MG tablet Take by mouth at bedtime.   . [DISCONTINUED] tamsulosin (FLOMAX) 0.4 MG CAPS capsule Take 1 capsule (0.4 mg total) by mouth daily.    Allergies  Allergen Reactions  . Tegretol [Carbamazepine] Rash      Review of Systems     Objective:   Physical Exam   HEENT: PERRL, EOMI, discs sharp bilaterally, throat without injection Neck:  Supple, No adenopathy Chest:  CTA CV:  RRR  with normal S1 and S2, No S3, S4 or murmur.  Radial pulses normal and equal WU:[id number]:bilateraly knees with mild hypertrophic changes.  No palpable effusion. No joint line tenderness. No pain or laxity on stress of cruciates or collateral ligaments.  No tenderness with patellar compression against anterior joint.        Assessment & Plan:  1.  Bilateral knee pain:  Discussed would likely not get xrays of knees this year through orange card.  He agrees [facility] and apply for financial assistance subsequently. [facility]. He does have Meloxicam to use when needed.  2.  BPH:  Increase Tamsulosin back up to the 0.8 mg he states he was using previously.  He is willing to try the Avodart0.5 mg daily as well.  3.  Decreased visual acuity:  Eye referral.  4.  Dental decay and concerns:  Dental referral.  5.  HM:  Influenza vaccine.  Return for fasting labs in 1 week:  FLP, hepatic profile TSH  Follow up with me in  4 months.

## 2017-09-13 ENCOUNTER — Encounter: Payer: Self-pay | Admitting: Internal Medicine

## 2017-11-08 ENCOUNTER — Ambulatory Visit: Payer: Self-pay | Admitting: Internal Medicine

## 2017-11-24 ENCOUNTER — Ambulatory Visit: Payer: Self-pay | Admitting: Internal Medicine

## 2018-06-02 ENCOUNTER — Other Ambulatory Visit: Payer: Self-pay | Admitting: Internal Medicine

## 2018-08-04 ENCOUNTER — Other Ambulatory Visit: Payer: Self-pay | Admitting: Internal Medicine

## 2018-09-02 ENCOUNTER — Other Ambulatory Visit: Payer: Self-pay | Admitting: Internal Medicine

## 2018-10-04 ENCOUNTER — Other Ambulatory Visit: Payer: Self-pay | Admitting: Internal Medicine

## 2018-10-04 NOTE — Telephone Encounter (Signed)
To Dr. Mulberry for approval 

## 2018-11-05 ENCOUNTER — Other Ambulatory Visit: Payer: Self-pay | Admitting: Internal Medicine

## 2020-10-25 NOTE — Progress Notes (Deleted)
Office Visit Note  Patient: Paul Ho             Date of Birth: 11/09/1960           MRN: [id number]             PCP: Leilani Able, MD Referring: Leilani Able, MD Visit Date: 11/04/2020 Occupation: @GUAROCC @  Subjective:  No chief complaint on file.   History of Present Illness: Paul Ho is a 60 y.o. male ***   Activities of Daily Living:  Patient reports morning stiffness for *** {minute/hour:19697}.   Patient {ACTIONS;DENIES/REPORTS:21021675::"Denies"} nocturnal pain.  Difficulty dressing/grooming: {ACTIONS;DENIES/REPORTS:21021675::"Denies"} Difficulty climbing stairs: {ACTIONS;DENIES/REPORTS:21021675::"Denies"} Difficulty getting out of chair: {ACTIONS;DENIES/REPORTS:21021675::"Denies"} Difficulty using hands for taps, buttons, cutlery, and/or writing: {ACTIONS;DENIES/REPORTS:21021675::"Denies"}  No Rheumatology ROS completed.   PMFS History:  Patient Active Problem List   Diagnosis Date Noted  . Diabetes mellitus (HCC) 05/11/2017  . Obesity 05/11/2017  . Schizoaffective disorder, bipolar type (HCC) 05/11/2017  . Osteoarthritis of knees, bilateral 05/11/2017  . Raynaud's disease 07/23/2015  . Bipolar disorder current episode depressed (HCC) 07/23/2015  . Benign prostatic hyperplasia with urinary retention 07/23/2015  . Carpal tunnel syndrome, bilateral 07/23/2015    Past Medical History:  Diagnosis Date  . Bipolar disorder current episode depressed (HCC) 2014  . BPH (benign prostatic hypertrophy) with urinary retention 2013  . Carpal tunnel syndrome, bilateral 2013  . Diabetes mellitus (HCC) 05/11/2017  . Obesity 05/11/2017  . Osteoarthritis of knees, bilateral 05/11/2017  . Raynaud's disease 2013  . Schizoaffective disorder, bipolar type (HCC) 05/11/2017  . Thyroid disease 2015   hypothyroidism    Family History  Problem Relation Age of Onset  . Alzheimer's disease Mother   . Heart disease Father        2 MIs in past  . Arthritis Father        Also  followed by Dr. 2016  . Restless legs syndrome Father   . Arthritis/Rheumatoid Brother        ?Adult Still Disease  . Diabetes Brother    Past Surgical History:  Procedure Laterality Date  . LEG SURGERY     Social History   Social History Narrative   Originally from [place] from [place].   [employer]   Previously worked at [employer] History  Administered Date(s) Administered  . Influenza Inj Mdck Quad Pf 08/23/2017     Objective: Vital Signs: There were no vitals taken for this visit.   Physical Exam   Musculoskeletal Exam: ***  CDAI Exam: CDAI Score: -- Patient Global: --; Provider Global: -- Swollen: --; Tender: -- Joint Exam 11/04/2020   No joint exam has been documented for this visit   There is currently no information documented on the homunculus. Go to the Rheumatology activity and complete the homunculus joint exam.  Investigation: No additional findings.  Imaging: No results found.  Recent Labs: Lab Results  Component Value Date   WBC 6.6 05/13/2017   HGB 15.1 05/13/2017   PLT 330 05/13/2017   NA 141 05/13/2017   K 4.5 05/13/2017   CL 103 05/13/2017   CO2 23 05/13/2017   GLUCOSE 102 (H) 05/13/2017   BUN 11 05/13/2017   CREATININE 0.94 05/13/2017   BILITOT 0.2 05/13/2017   ALKPHOS 85 05/13/2017   AST 28 05/13/2017   ALT 51 (H) 05/13/2017   PROT 7.2 05/13/2017   ALBUMIN 4.2 05/13/2017   CALCIUM 9.2 05/13/2017  GFRAA 104 05/13/2017    Speciality Comments: No specialty comments available.  Procedures:  No procedures performed Allergies: Tegretol [carbamazepine]   Assessment / Plan:     Visit Diagnoses: Bilateral hand pain - 07/03/15: ANA negative   Raynaud's disease without gangrene  Carpal tunnel syndrome, bilateral  Primary osteoarthritis of both knees  Type 2 diabetes mellitus without complication, without long-term current use of insulin  (HCC)  Benign prostatic hyperplasia with urinary retention  Schizoaffective disorder, bipolar type (HCC)  Orders: No orders of the defined types were placed in this encounter.  No orders of the defined types were placed in this encounter.   Face-to-face time spent with patient was *** minutes. Greater than 50% of time was spent in counseling and coordination of care.  Follow-Up Instructions: No follow-ups on file.   Gearldine Bienenstock, PA-C  Note - This record has been created using Dragon software.  Chart creation errors have been sought, but may not always  have been located. Such creation errors do not reflect on  the standard of medical care.

## 2020-11-04 ENCOUNTER — Ambulatory Visit: Payer: Self-pay | Admitting: Rheumatology

## 2020-11-25 NOTE — Progress Notes (Deleted)
Office Visit Note  Patient: Paul Ho             Date of Birth: [birth year]           MRN: [id number]             PCP: Leilani Able, MD Referring: Leilani Able, MD Visit Date: 11/26/2020 Occupation: @GUAROCC @  Subjective:  No chief complaint on file.   History of Present Illness: Paul Ho is a 60 y.o. male ***   Activities of Daily Living:  Patient reports morning stiffness for *** {minute/hour:19697}.   Patient {ACTIONS;DENIES/REPORTS:21021675::"Denies"} nocturnal pain.  Difficulty dressing/grooming: {ACTIONS;DENIES/REPORTS:21021675::"Denies"} Difficulty climbing stairs: {ACTIONS;DENIES/REPORTS:21021675::"Denies"} Difficulty getting out of chair: {ACTIONS;DENIES/REPORTS:21021675::"Denies"} Difficulty using hands for taps, buttons, cutlery, and/or writing: {ACTIONS;DENIES/REPORTS:21021675::"Denies"}  No Rheumatology ROS completed.   PMFS History:  Patient Active Problem List   Diagnosis Date Noted  . Diabetes mellitus (HCC) 05/11/2017  . Obesity 05/11/2017  . Schizoaffective disorder, bipolar type (HCC) 05/11/2017  . Osteoarthritis of knees, bilateral 05/11/2017  . Raynaud's disease 07/23/2015  . Bipolar disorder current episode depressed (HCC) 07/23/2015  . Benign prostatic hyperplasia with urinary retention 07/23/2015  . Carpal tunnel syndrome, bilateral 07/23/2015    Past Medical History:  Diagnosis Date  . Bipolar disorder current episode depressed (HCC) 2014  . BPH (benign prostatic hypertrophy) with urinary retention 2013  . Carpal tunnel syndrome, bilateral 2013  . Diabetes mellitus (HCC) 05/11/2017  . Obesity 05/11/2017  . Osteoarthritis of knees, bilateral 05/11/2017  . Raynaud's disease 2013  . Schizoaffective disorder, bipolar type (HCC) 05/11/2017  . Thyroid disease 2015   hypothyroidism    Family History  Problem Relation Age of Onset  . Alzheimer's disease Mother   . Heart disease Father        2 MIs in past  . Arthritis Father        Also  followed by Dr. 2016  . Restless legs syndrome Father   . Arthritis/Rheumatoid Brother        ?Adult Still Disease  . Diabetes Brother    Past Surgical History:  Procedure Laterality Date  . LEG SURGERY     Social History   Social History Narrative   Originally from [place] from [place].   [employer]   Previously worked at [employer] History  Administered Date(s) Administered  . Influenza Inj Mdck Quad Pf 08/23/2017     Objective: Vital Signs: There were no vitals taken for this visit.   Physical Exam   Musculoskeletal Exam: ***  CDAI Exam: CDAI Score: - Patient Global: -; Provider Global: - Swollen: -; Tender: - Joint Exam 11/26/2020   No joint exam has been documented for this visit   There is currently no information documented on the homunculus. Go to the Rheumatology activity and complete the homunculus joint exam.  Investigation: No additional findings.  Imaging: No results found.  Recent Labs: Lab Results  Component Value Date   WBC 6.6 05/13/2017   HGB 15.1 05/13/2017   PLT 330 05/13/2017   NA 141 05/13/2017   K 4.5 05/13/2017   CL 103 05/13/2017   CO2 23 05/13/2017   GLUCOSE 102 (H) 05/13/2017   BUN 11 05/13/2017   CREATININE 0.94 05/13/2017   BILITOT 0.2 05/13/2017   ALKPHOS 85 05/13/2017   AST 28 05/13/2017   ALT 51 (H) 05/13/2017   PROT 7.2 05/13/2017   ALBUMIN 4.2 05/13/2017   CALCIUM 9.2 05/13/2017  GFRAA 104 05/13/2017    Speciality Comments: No specialty comments available.  Procedures:  No procedures performed Allergies: Tegretol [carbamazepine]   Assessment / Plan:     Visit Diagnoses: No diagnosis found.  Orders: No orders of the defined types were placed in this encounter.  No orders of the defined types were placed in this encounter.   Face-to-face time spent with patient was *** minutes. Greater than 50% of time was spent in  counseling and coordination of care.  Follow-Up Instructions: No follow-ups on file.   Pollyann Savoy, MD  Note - This record has been created using Animal nutritionist.  Chart creation errors have been sought, but may not always  have been located. Such creation errors do not reflect on  the standard of medical care.

## 2020-11-26 ENCOUNTER — Ambulatory Visit: Payer: Self-pay | Admitting: Rheumatology

## 2021-03-04 ENCOUNTER — Other Ambulatory Visit: Payer: Self-pay | Admitting: Orthopedic Surgery

## 2021-04-21 ENCOUNTER — Encounter (HOSPITAL_BASED_OUTPATIENT_CLINIC_OR_DEPARTMENT_OTHER): Payer: Self-pay | Admitting: Orthopedic Surgery

## 2021-04-21 ENCOUNTER — Other Ambulatory Visit: Payer: Self-pay

## 2021-04-21 NOTE — Progress Notes (Signed)
While completing PAT phone interview pt fell asleep at the end during instructions. I sent a text message with NPO status and instructions for DOS including arrival time and address. I also sent text instructing pt to come in for his EKG and blood work prior to surgery. Mon-Fri 0730-1500.

## 2021-04-27 ENCOUNTER — Encounter (HOSPITAL_BASED_OUTPATIENT_CLINIC_OR_DEPARTMENT_OTHER): Payer: Self-pay | Admitting: Orthopedic Surgery

## 2021-04-27 NOTE — Anesthesia Preprocedure Evaluation (Addendum)
Anesthesia Evaluation  Patient identified by MRN, date of birth, ID band Patient awake    Reviewed: Allergy & Precautions, NPO status , Patient's Chart, lab work & pertinent test results  Airway Mallampati: II  TM Distance: >3 FB Neck ROM: Full    Dental no notable dental hx. (+) Dental Advisory Given   Pulmonary Current SmokerPatient did not abstain from smoking.,    Pulmonary exam normal breath sounds clear to auscultation       Cardiovascular Normal cardiovascular exam Rhythm:Regular Rate:Normal  Hx/o Raynaud's disease   Neuro/Psych PSYCHIATRIC DISORDERS Depression Bipolar Disorder Hx/o Biplolar Disease Schizoaffective - stable on medicationsLeft CTS  Neuromuscular disease    GI/Hepatic negative GI ROS, (+)     substance abuse  cocaine use,   Endo/Other  diabetes, Well Controlled, Type 2, Oral Hypoglycemic AgentsObesity  Renal/GU negative Renal ROS   Hx/o BPH with urinary retention    Musculoskeletal  (+) Arthritis , Osteoarthritis,  OA bilateral knees   Abdominal (+) + obese,   Peds  Hematology negative hematology ROS (+)   Anesthesia Other Findings   Reproductive/Obstetrics                           Anesthesia Physical Anesthesia Plan  ASA: 3  Anesthesia Plan: MAC and Bier Block and Bier Block-LIDOCAINE ONLY   Post-op Pain Management:    Induction: Intravenous  PONV Risk Score and Plan: 1 and Treatment may vary due to age or medical condition, Midazolam and Ondansetron  Airway Management Planned: Natural Airway and Simple Face Mask  Additional Equipment:   Intra-op Plan:   Post-operative Plan:   Informed Consent: I have reviewed the patients History and Physical, chart, labs and discussed the procedure including the risks, benefits and alternatives for the proposed anesthesia with the patient or authorized representative who has indicated his/her understanding and  acceptance.     Dental advisory given  Plan Discussed with: CRNA and Anesthesiologist  Anesthesia Plan Comments:        Anesthesia Quick Evaluation

## 2021-04-28 ENCOUNTER — Ambulatory Visit (HOSPITAL_BASED_OUTPATIENT_CLINIC_OR_DEPARTMENT_OTHER): Payer: Medicaid Other | Admitting: Anesthesiology

## 2021-04-28 ENCOUNTER — Encounter (HOSPITAL_BASED_OUTPATIENT_CLINIC_OR_DEPARTMENT_OTHER): Payer: Self-pay | Admitting: Orthopedic Surgery

## 2021-04-28 ENCOUNTER — Other Ambulatory Visit: Payer: Self-pay

## 2021-04-28 ENCOUNTER — Encounter (HOSPITAL_BASED_OUTPATIENT_CLINIC_OR_DEPARTMENT_OTHER)
Admission: RE | Disposition: A | Discharge status: Home / Self Care | Payer: Self-pay | Source: Home / Self Care | Attending: Orthopedic Surgery

## 2021-04-28 ENCOUNTER — Ambulatory Visit (HOSPITAL_BASED_OUTPATIENT_CLINIC_OR_DEPARTMENT_OTHER)
Admission: RE | Admit: 2021-04-28 | Discharge: 2021-04-28 | Disposition: A | Discharge status: Home / Self Care | Payer: Medicaid Other | Attending: Orthopedic Surgery | Admitting: Orthopedic Surgery

## 2021-04-28 DIAGNOSIS — Z833 Family history of diabetes mellitus: Secondary | ICD-10-CM | POA: Diagnosis not present

## 2021-04-28 DIAGNOSIS — Z8249 Family history of ischemic heart disease and other diseases of the circulatory system: Secondary | ICD-10-CM | POA: Diagnosis not present

## 2021-04-28 DIAGNOSIS — Z6831 Body mass index (BMI) 31.0-31.9, adult: Secondary | ICD-10-CM | POA: Insufficient documentation

## 2021-04-28 DIAGNOSIS — E039 Hypothyroidism, unspecified: Secondary | ICD-10-CM | POA: Diagnosis not present

## 2021-04-28 DIAGNOSIS — F17210 Nicotine dependence, cigarettes, uncomplicated: Secondary | ICD-10-CM | POA: Insufficient documentation

## 2021-04-28 DIAGNOSIS — G5602 Carpal tunnel syndrome, left upper limb: Secondary | ICD-10-CM | POA: Diagnosis not present

## 2021-04-28 DIAGNOSIS — Z79899 Other long term (current) drug therapy: Secondary | ICD-10-CM | POA: Diagnosis not present

## 2021-04-28 DIAGNOSIS — Z7984 Long term (current) use of oral hypoglycemic drugs: Secondary | ICD-10-CM | POA: Diagnosis not present

## 2021-04-28 DIAGNOSIS — Z791 Long term (current) use of non-steroidal anti-inflammatories (NSAID): Secondary | ICD-10-CM | POA: Insufficient documentation

## 2021-04-28 DIAGNOSIS — E669 Obesity, unspecified: Secondary | ICD-10-CM | POA: Diagnosis not present

## 2021-04-28 DIAGNOSIS — Z888 Allergy status to other drugs, medicaments and biological substances status: Secondary | ICD-10-CM | POA: Insufficient documentation

## 2021-04-28 DIAGNOSIS — E119 Type 2 diabetes mellitus without complications: Secondary | ICD-10-CM | POA: Diagnosis not present

## 2021-04-28 DIAGNOSIS — I7300 Raynaud's syndrome without gangrene: Secondary | ICD-10-CM | POA: Insufficient documentation

## 2021-04-28 HISTORY — PX: CARPAL TUNNEL RELEASE: SHX101

## 2021-04-28 LAB — COMPREHENSIVE METABOLIC PANEL
ALT: 12 U/L (ref 0–44)
AST: 15 U/L (ref 15–41)
Albumin: 3.5 g/dL (ref 3.5–5.0)
Alkaline Phosphatase: 52 U/L (ref 38–126)
Anion gap: 8 (ref 5–15)
BUN: 11 mg/dL (ref 8–23)
CO2: 25 mmol/L (ref 22–32)
Calcium: 9.1 mg/dL (ref 8.9–10.3)
Chloride: 107 mmol/L (ref 98–111)
Creatinine, Ser: 1.05 mg/dL (ref 0.61–1.24)
GFR, Estimated: >60 mL/min (ref >60)
Glucose, Bld: 89 mg/dL (ref 70–99)
Potassium: 3.8 mmol/L (ref 3.5–5.1)
Sodium: 140 mmol/L (ref 135–145)
Total Bilirubin: 1 mg/dL (ref 0.3–1.2)
Total Protein: 6.7 g/dL (ref 6.5–8.1)

## 2021-04-28 LAB — GLUCOSE, CAPILLARY
Glucose-Capillary: 86 mg/dL (ref 70–99)
Glucose-Capillary: 94 mg/dL (ref 70–99)

## 2021-04-28 SURGERY — CARPAL TUNNEL RELEASE
Anesthesia: Monitor Anesthesia Care | Laterality: Left

## 2021-04-28 MED ORDER — HYDROCODONE-ACETAMINOPHEN 5-325 MG PO TABS: ORAL_TABLET | ORAL | 0 refills | Status: DC

## 2021-04-28 MED ORDER — BUPIVACAINE HCL (PF) 0.25 % IJ SOLN
INTRAMUSCULAR | Status: DC | PRN
  Administered 2021-04-28: 10 mL

## 2021-04-28 MED ORDER — ONDANSETRON HCL 4 MG/2ML IJ SOLN
INTRAMUSCULAR | Status: AC
  Filled 2021-04-28: qty 2

## 2021-04-28 MED ORDER — ONDANSETRON HCL 4 MG/2ML IJ SOLN: 4.0000 mg | Freq: Once | INTRAMUSCULAR | Status: DC | PRN

## 2021-04-28 MED ORDER — MIDAZOLAM HCL 2 MG/2ML IJ SOLN
INTRAMUSCULAR | Status: AC
  Filled 2021-04-28: qty 2

## 2021-04-28 MED ORDER — PROPOFOL 500 MG/50ML IV EMUL
INTRAVENOUS | Status: AC
  Filled 2021-04-28: qty 50

## 2021-04-28 MED ORDER — LIDOCAINE HCL (PF) 0.5 % IJ SOLN
INTRAMUSCULAR | Status: AC
  Filled 2021-04-28: qty 50

## 2021-04-28 MED ORDER — OXYCODONE HCL 5 MG PO TABS: 5.0000 mg | ORAL_TABLET | Freq: Once | ORAL | Status: DC | PRN

## 2021-04-28 MED ORDER — BUPIVACAINE HCL (PF) 0.25 % IJ SOLN
INTRAMUSCULAR | Status: AC
  Filled 2021-04-28: qty 30

## 2021-04-28 MED ORDER — FENTANYL CITRATE (PF) 100 MCG/2ML IJ SOLN
INTRAMUSCULAR | Status: AC
  Filled 2021-04-28: qty 2

## 2021-04-28 MED ORDER — CEFAZOLIN SODIUM-DEXTROSE 2-4 GM/100ML-% IV SOLN
2.0000 g | INTRAVENOUS | Status: AC
  Administered 2021-04-28: 2 g via INTRAVENOUS

## 2021-04-28 MED ORDER — ONDANSETRON HCL 4 MG/2ML IJ SOLN
INTRAMUSCULAR | Status: DC | PRN
Start: 2021-04-28 — End: 2021-04-28
  Administered 2021-04-28: 4 mg via INTRAVENOUS

## 2021-04-28 MED ORDER — CEFAZOLIN SODIUM-DEXTROSE 2-4 GM/100ML-% IV SOLN
INTRAVENOUS | Status: AC
  Filled 2021-04-28: qty 100

## 2021-04-28 MED ORDER — MIDAZOLAM HCL 2 MG/2ML IJ SOLN
INTRAMUSCULAR | Status: DC | PRN
  Administered 2021-04-28: 2 mg via INTRAVENOUS

## 2021-04-28 MED ORDER — OXYCODONE HCL 5 MG/5ML PO SOLN: 5.0000 mg | Freq: Once | ORAL | Status: DC | PRN

## 2021-04-28 MED ORDER — FENTANYL CITRATE (PF) 100 MCG/2ML IJ SOLN
INTRAMUSCULAR | Status: DC | PRN
  Administered 2021-04-28: 100 ug via INTRAVENOUS

## 2021-04-28 MED ORDER — PROPOFOL 10 MG/ML IV BOLUS
INTRAVENOUS | Status: DC | PRN
  Administered 2021-04-28: 30 mg via INTRAVENOUS

## 2021-04-28 MED ORDER — FENTANYL CITRATE (PF) 100 MCG/2ML IJ SOLN: 25.0000 ug | INTRAMUSCULAR | Status: DC | PRN

## 2021-04-28 MED ORDER — LACTATED RINGERS IV SOLN: INTRAVENOUS | Status: DC

## 2021-04-28 MED ORDER — PROPOFOL 500 MG/50ML IV EMUL
INTRAVENOUS | Status: DC | PRN
  Administered 2021-04-28: 100 ug/kg/min via INTRAVENOUS

## 2021-04-28 SURGICAL SUPPLY — 32 items
BLADE SURG 15 STRL LF DISP TIS (BLADE) ×2 IMPLANT
BLADE SURG 15 STRL SS (BLADE) ×2
BNDG ELASTIC 3X5.8 VLCR STR LF (GAUZE/BANDAGES/DRESSINGS) ×2 IMPLANT
BNDG ESMARK 4X9 LF (GAUZE/BANDAGES/DRESSINGS) ×2 IMPLANT
BNDG GAUZE ELAST 4 BULKY (GAUZE/BANDAGES/DRESSINGS) ×2 IMPLANT
CHLORAPREP W/TINT 26 (MISCELLANEOUS) ×2 IMPLANT
CORD BIPOLAR FORCEPS 12FT (ELECTRODE) ×2 IMPLANT
COVER BACK TABLE 60X90IN (DRAPES) ×2 IMPLANT
COVER MAYO STAND STRL (DRAPES) ×2 IMPLANT
CUFF TOURN SGL QUICK 18X4 (TOURNIQUET CUFF) ×2 IMPLANT
DRAPE EXTREMITY T 121X128X90 (DISPOSABLE) ×2 IMPLANT
DRAPE SURG 17X23 STRL (DRAPES) ×2 IMPLANT
DRSG PAD ABDOMINAL 8X10 ST (GAUZE/BANDAGES/DRESSINGS) ×2 IMPLANT
GAUZE SPONGE 4X4 12PLY STRL (GAUZE/BANDAGES/DRESSINGS) ×2 IMPLANT
GAUZE XEROFORM 1X8 LF (GAUZE/BANDAGES/DRESSINGS) ×2 IMPLANT
GLOVE SRG 8 PF TXTR STRL LF DI (GLOVE) ×1 IMPLANT
GLOVE SURG ENC MOIS LTX SZ7.5 (GLOVE) ×2 IMPLANT
GLOVE SURG UNDER POLY LF SZ8 (GLOVE) ×1
GOWN STRL REUS W/ TWL LRG LVL3 (GOWN DISPOSABLE) ×1 IMPLANT
GOWN STRL REUS W/TWL LRG LVL3 (GOWN DISPOSABLE) ×1
GOWN STRL REUS W/TWL XL LVL3 (GOWN DISPOSABLE) ×2 IMPLANT
NEEDLE HYPO 25X1 1.5 SAFETY (NEEDLE) ×2 IMPLANT
NS IRRIG 1000ML POUR BTL (IV SOLUTION) ×2 IMPLANT
PACK BASIN DAY SURGERY FS (CUSTOM PROCEDURE TRAY) ×2 IMPLANT
PADDING CAST ABS 4INX4YD NS (CAST SUPPLIES) ×1
PADDING CAST ABS COTTON 4X4 ST (CAST SUPPLIES) ×1 IMPLANT
STOCKINETTE 4X48 STRL (DRAPES) ×2 IMPLANT
SUT ETHILON 4 0 PS 2 18 (SUTURE) ×2 IMPLANT
SYR BULB EAR ULCER 3OZ GRN STR (SYRINGE) ×2 IMPLANT
SYR CONTROL 10ML LL (SYRINGE) ×2 IMPLANT
TOWEL GREEN STERILE FF (TOWEL DISPOSABLE) ×4 IMPLANT
UNDERPAD 30X36 HEAVY ABSORB (UNDERPADS AND DIAPERS) ×2 IMPLANT

## 2021-04-28 NOTE — Anesthesia Postprocedure Evaluation (Signed)
Anesthesia Post Note  Patient: Paul Ho  Procedure(s) Performed: LEFT CARPAL TUNNEL RELEASE (Left)     Patient location during evaluation: PACU Anesthesia Type: MAC and Bier Block Level of consciousness: awake and alert and oriented Pain management: pain level controlled Vital Signs Assessment: post-procedure vital signs reviewed and stable Respiratory status: spontaneous breathing, nonlabored ventilation and respiratory function stable Cardiovascular status: stable and blood pressure returned to baseline Postop Assessment: no apparent nausea or vomiting Anesthetic complications: no   No notable events documented.  Last Vitals:  Vitals:   04/28/21 1500 04/28/21 1517  BP: 104/81 118/81  Pulse: 79 72  Resp: 17 16  Temp:  36.7 C  SpO2: 96% 94%    Last Pain:  Vitals:   04/28/21 1517  TempSrc:   PainSc: 0-No pain                 Rea Kalama A.

## 2021-04-28 NOTE — Discharge Instructions (Addendum)

## 2021-04-28 NOTE — Transfer of Care (Signed)
Immediate Anesthesia Transfer of Care Note  Patient: Paul Ho  Procedure(s) Performed: LEFT CARPAL TUNNEL RELEASE (Left)  Patient Location: PACU  Anesthesia Type:Bier block  Level of Consciousness: drowsy  Airway & Oxygen Therapy: Patient Spontanous Breathing and Patient connected to face mask oxygen  Post-op Assessment: Report given to RN and Post -op Vital signs reviewed and stable  Post vital signs: Reviewed and stable  Last Vitals:  Vitals Value Taken Time  BP 101/69 04/28/21 1441  Temp 36.6 C 04/28/21 1442  Pulse 81 04/28/21 1443  Resp 17 04/28/21 1443  SpO2 99 % 04/28/21 1443  Vitals shown include unvalidated device data.  Last Pain:  Vitals:   04/28/21 1246  TempSrc: Oral  PainSc: 5       Patients Stated Pain Goal: 5 (04/28/21 1246)  Complications: No notable events documented.

## 2021-04-28 NOTE — Op Note (Signed)
04/28/2021 [facility]                              OPERATIVE REPORT   PREOPERATIVE DIAGNOSIS:  Left carpal tunnel syndrome.  POSTOPERATIVE DIAGNOSIS:  Left carpal tunnel syndrome.  PROCEDURE:  Left carpal tunnel release.  SURGEON:  Betha Loa, MD  ASSISTANT:  none.  ANESTHESIA: Bier block with sedation  IV FLUIDS:  Per anesthesia flow sheet.  ESTIMATED BLOOD LOSS:  Minimal.  COMPLICATIONS:  None.  SPECIMENS:  None.  TOURNIQUET TIME:    Total Tourniquet Time Documented: Upper Arm (Left) - 28 minutes Total: Upper Arm (Left) - 28 minutes   DISPOSITION:  Stable to PACU.  LOCATION: [facility]  INDICATIONS:  60 yo male with numbness and tingling in fingers.  Nocturnal symptoms.  Positive nerve conduction studies.  He wishes to have a carpal tunnel release for management of his symptoms.  Risks, benefits and alternatives of surgery were discussed including the risk of blood loss; infection; damage to nerves, vessels, tendons, ligaments, bone; failure of surgery; need for additional surgery; complications with wound healing; continued pain; recurrence of carpal tunnel syndrome; and damage to motor branch. He voiced understanding of these risks and elected to proceed.   OPERATIVE COURSE:  After being identified preoperatively by myself, the patient and I agreed upon the procedure and site of procedure.  The surgical site was marked.  Surgical consent had been signed.  He was given preoperative IV antibiotic prophylaxis.  He was transferred to the operating room and placed on the operating room table in supine position with the Left upper extremity on an armboard.  Bier block anesthesia was induced by the anesthesiologist.  Left upper extremity was prepped and draped in normal sterile orthopaedic fashion.  A surgical pause was performed between the surgeons, anesthesia, and operating room staff, and all were in agreement as to the patient, procedure, and  site of procedure.  Tourniquet at the proximal aspect of the forearm had been inflated for the Bier block  Incision was made over the transverse carpal ligament and carried into the subcutaneous tissues by spreading technique.  Bipolar electrocautery was used to obtain hemostasis.  The palmar fascia was sharply incised.  The transverse carpal ligament was identified and sharply incised.  It was incised distally first.  The flexor tendons were identified.  The flexor tendon to the ring finger was identified and retracted radially.  The transverse carpal ligament was then incised proximally.  Scissors were used to split the distal aspect of the volar antebrachial fascia.  A finger was placed into the wound to ensure complete decompression, which was the case.  The nerve was examined.  It was flattened and hyperemic.  The motor branch was identified and was intact.  The wound was copiously irrigated with sterile saline.  It was then closed with 4-0 nylon in a horizontal mattress fashion.  It was injected with 0.25% plain Marcaine to aid in postoperative analgesia.  It was dressed with sterile Xeroform, 4x4s, an ABD, and wrapped with Kerlix and an Ace bandage.  Tourniquet was deflated at 28 minutes.  Fingertips were pink with brisk capillary refill after deflation of the tourniquet.  Operative drapes were broken down.  The patient was awoken from anesthesia safely.  He was transferred back to stretcher and taken to the PACU in stable condition.  I will see him back in the office in 1  week for postoperative followup.  I will give him a prescription for Norco 5/325 1-2 tabs PO q6 hours prn pain, dispense # 20.    Betha Loa, MD Electronically signed, 04/28/21

## 2021-04-28 NOTE — H&P (Signed)
Paul Ho is an 60 y.o. male.   Chief Complaint: carpal tunnel syndrome HPI: 60 yo male with numbness and tingling in the hands.  Nocturnal symptoms that wake him.  Positive nerve conduction studies.  He wishes to have left carpal tunnel release.  Allergies:  Allergies  Allergen Reactions   Tegretol [Carbamazepine] Rash    Past Medical History:  Diagnosis Date   Bipolar disorder current episode depressed (HCC) 2014   BPH (benign prostatic hypertrophy) with urinary retention 2013   Carpal tunnel syndrome, bilateral 2013   Diabetes mellitus (HCC) 05/11/2017   Obesity 05/11/2017   Osteoarthritis of knees, bilateral 05/11/2017   Raynaud's disease 2013   Schizoaffective disorder, bipolar type (HCC) 05/11/2017   Thyroid disease 2015   hypothyroidism    Past Surgical History:  Procedure Laterality Date   LEG SURGERY      Family History: Family History  Problem Relation Age of Onset   Alzheimer's disease Mother    Heart disease Father        2 MIs in past   Arthritis Father        Also followed by Dr. Kellie Simmering   Restless legs syndrome Father    Arthritis/Rheumatoid Brother        ?Adult Still Disease   Diabetes Brother     Social History:   reports that he has been smoking cigarettes. He started smoking about 46 years ago. He has been smoking an average of .25 packs per day. He has never used smokeless tobacco. He reports current alcohol use of about 2.0 standard drinks of alcohol per week. He reports current drug use. Drug: Cocaine.  Medications: Medications Prior to Admission  Medication Sig Dispense Refill   gabapentin (NEURONTIN) 100 MG capsule Take 100 mg by mouth 2 (two) times daily.     hydrOXYzine (VISTARIL) 25 MG capsule Take 25 mg by mouth 2 (two) times daily.      lurasidone (LATUDA) 40 MG TABS tablet Take 40 mg by mouth daily with breakfast.     tamsulosin (FLOMAX) 0.4 MG CAPS capsule Take 2 capsules (0.8 mg total) daily by mouth. 30 capsule 11   amitriptyline  (ELAVIL) 25 MG tablet Take 1 tablet (25 mg total) by mouth at bedtime. (Patient not taking: Reported on 08/23/2017) 30 tablet 11   amLODipine (NORVASC) 10 MG tablet TAKE 1 TABLET BY MOUTH ONCE DAILY 30 tablet 0   atorvastatin (LIPITOR) 40 MG tablet TAKE 1 TABLET BY MOUTH ONCE DAILY 30 tablet 1   benztropine (COGENTIN) 1 MG tablet Take 1 mg by mouth at bedtime.     dutasteride (AVODART) 0.5 MG capsule Take 1 capsule (0.5 mg total) daily by mouth. 30 capsule 11   FLUoxetine (PROZAC) 20 MG capsule Take 20 mg by mouth daily.     glipiZIDE (GLUCOTROL XL) 5 MG 24 hr tablet Take 1 tablet (5 mg total) by mouth daily with breakfast. 30 tablet 11   haloperidol (HALDOL) 2 MG tablet Take 2 mg by mouth at bedtime.     levothyroxine (SYNTHROID, LEVOTHROID) 125 MCG tablet TAKE 1/2 (ONE-HALF) TABLET BY MOUTH ONCE DAILY BEFORE BREAKFAST 15 tablet 1   meloxicam (MOBIC) 15 MG tablet TAKE 1/2 (ONE-HALF) TABLET BY MOUTH TWICE DAILY 30 tablet 0   meloxicam (MOBIC) 7.5 MG tablet Take 1 tablet (7.5 mg total) by mouth 2 (two) times daily. 60 tablet 6   traZODone (DESYREL) 150 MG tablet Take by mouth at bedtime.       No results  found for this or any previous visit (from the past 48 hour(s)).  No results found.   A comprehensive review of systems was negative.  There were no vitals taken for this visit.  General appearance: alert, cooperative, and appears stated age Head: Normocephalic, without obvious abnormality, atraumatic Neck: supple, symmetrical, trachea midline Cardio: regular rate and rhythm Resp: clear to auscultation bilaterally Extremities: Intact sensation and capillary refill all digits.  +epl/fpl/io.  No wounds.  Pulses: 2+ and symmetric Skin: Skin color, texture, turgor normal. No rashes or lesions Neurologic: Grossly normal Incision/Wound: none  Assessment/Plan Left carpal tunnel syndrome.  Non operative and operative treatment options have been discussed with the patient and patient wishes  to proceed with operative treatment. Risks, benefits, and alternatives of surgery have been discussed and the patient agrees with the plan of care.   Betha Loa 04/28/2021, 12:23 PM

## 2021-04-28 NOTE — Anesthesia Procedure Notes (Signed)
Anesthesia Regional Block: Bier block (IV Regional)   Pre-Anesthetic Checklist: , timeout performed,  Correct Patient, Correct Site, Correct Laterality,  Correct Procedure, Correct Position, risks and benefits discussed,  Surgical consent,  Pre-op evaluation,  At surgeon's request  Laterality: Left  Prep: alcohol swabs        Procedures:,,,,, intact distal pulses, Esmarch exsanguination,  Single tourniquet utilized    Narrative:  Start time: 04/28/2021 2:08 PM End time: 04/28/2021 2:09 PM

## 2021-04-29 ENCOUNTER — Encounter (HOSPITAL_BASED_OUTPATIENT_CLINIC_OR_DEPARTMENT_OTHER): Payer: Self-pay | Admitting: Orthopedic Surgery

## 2021-07-24 ENCOUNTER — Other Ambulatory Visit: Payer: Self-pay | Admitting: Orthopedic Surgery

## 2021-08-20 ENCOUNTER — Encounter (HOSPITAL_BASED_OUTPATIENT_CLINIC_OR_DEPARTMENT_OTHER): Payer: Self-pay | Admitting: Orthopedic Surgery

## 2021-08-20 ENCOUNTER — Other Ambulatory Visit: Payer: Self-pay

## 2021-08-20 NOTE — Progress Notes (Signed)

## 2021-08-22 ENCOUNTER — Ambulatory Visit (HOSPITAL_BASED_OUTPATIENT_CLINIC_OR_DEPARTMENT_OTHER): Payer: Medicaid Other | Admitting: Anesthesiology

## 2021-08-22 ENCOUNTER — Other Ambulatory Visit: Payer: Self-pay

## 2021-08-22 ENCOUNTER — Ambulatory Visit (HOSPITAL_BASED_OUTPATIENT_CLINIC_OR_DEPARTMENT_OTHER)
Admission: RE | Admit: 2021-08-22 | Discharge: 2021-08-22 | Disposition: A | Discharge status: Home / Self Care | Payer: Medicaid Other | Attending: Orthopedic Surgery | Admitting: Orthopedic Surgery

## 2021-08-22 ENCOUNTER — Encounter (HOSPITAL_BASED_OUTPATIENT_CLINIC_OR_DEPARTMENT_OTHER)
Admission: RE | Disposition: A | Discharge status: Home / Self Care | Payer: Self-pay | Source: Home / Self Care | Attending: Orthopedic Surgery

## 2021-08-22 ENCOUNTER — Encounter (HOSPITAL_BASED_OUTPATIENT_CLINIC_OR_DEPARTMENT_OTHER): Payer: Self-pay | Admitting: Orthopedic Surgery

## 2021-08-22 DIAGNOSIS — E119 Type 2 diabetes mellitus without complications: Secondary | ICD-10-CM | POA: Diagnosis not present

## 2021-08-22 DIAGNOSIS — F17210 Nicotine dependence, cigarettes, uncomplicated: Secondary | ICD-10-CM | POA: Insufficient documentation

## 2021-08-22 DIAGNOSIS — F319 Bipolar disorder, unspecified: Secondary | ICD-10-CM | POA: Insufficient documentation

## 2021-08-22 DIAGNOSIS — G5601 Carpal tunnel syndrome, right upper limb: Secondary | ICD-10-CM | POA: Diagnosis present

## 2021-08-22 DIAGNOSIS — F209 Schizophrenia, unspecified: Secondary | ICD-10-CM | POA: Insufficient documentation

## 2021-08-22 HISTORY — PX: CARPAL TUNNEL RELEASE: SHX101

## 2021-08-22 SURGERY — CARPAL TUNNEL RELEASE
Anesthesia: Regional | Site: Wrist | Laterality: Right

## 2021-08-22 MED ORDER — MIDAZOLAM HCL 2 MG/2ML IJ SOLN
INTRAMUSCULAR | Status: AC
  Filled 2021-08-22: qty 2

## 2021-08-22 MED ORDER — FENTANYL CITRATE (PF) 100 MCG/2ML IJ SOLN
INTRAMUSCULAR | Status: AC
  Filled 2021-08-22: qty 2

## 2021-08-22 MED ORDER — FENTANYL CITRATE (PF) 100 MCG/2ML IJ SOLN: 25.0000 ug | INTRAMUSCULAR | Status: DC | PRN

## 2021-08-22 MED ORDER — FENTANYL CITRATE (PF) 100 MCG/2ML IJ SOLN
INTRAMUSCULAR | Status: DC | PRN
  Administered 2021-08-22 (×2): 50 ug via INTRAVENOUS

## 2021-08-22 MED ORDER — DEXAMETHASONE SODIUM PHOSPHATE 10 MG/ML IJ SOLN
INTRAMUSCULAR | Status: AC
  Filled 2021-08-22: qty 1

## 2021-08-22 MED ORDER — ONDANSETRON HCL 4 MG/2ML IJ SOLN
INTRAMUSCULAR | Status: AC
  Filled 2021-08-22: qty 2

## 2021-08-22 MED ORDER — MIDAZOLAM HCL 5 MG/5ML IJ SOLN
INTRAMUSCULAR | Status: DC | PRN
  Administered 2021-08-22: 2 mg via INTRAVENOUS

## 2021-08-22 MED ORDER — CEFAZOLIN SODIUM-DEXTROSE 2-4 GM/100ML-% IV SOLN
2.0000 g | INTRAVENOUS | Status: AC
  Administered 2021-08-22: 2 g via INTRAVENOUS

## 2021-08-22 MED ORDER — ACETAMINOPHEN 10 MG/ML IV SOLN: 1000.0000 mg | Freq: Once | INTRAVENOUS | Status: DC | PRN

## 2021-08-22 MED ORDER — ACETAMINOPHEN 160 MG/5ML PO SOLN: 325.0000 mg | ORAL | Status: DC | PRN

## 2021-08-22 MED ORDER — AMISULPRIDE (ANTIEMETIC) 5 MG/2ML IV SOLN: 10.0000 mg | Freq: Once | INTRAVENOUS | Status: DC | PRN

## 2021-08-22 MED ORDER — BUPIVACAINE HCL (PF) 0.25 % IJ SOLN
INTRAMUSCULAR | Status: DC | PRN
  Administered 2021-08-22: 10 mL

## 2021-08-22 MED ORDER — OXYCODONE HCL 5 MG PO TABS: 5.0000 mg | ORAL_TABLET | Freq: Once | ORAL | Status: DC | PRN

## 2021-08-22 MED ORDER — 0.9 % SODIUM CHLORIDE (POUR BTL) OPTIME
TOPICAL | Status: DC | PRN
  Administered 2021-08-22: 100 mL

## 2021-08-22 MED ORDER — OXYCODONE-ACETAMINOPHEN 5-325 MG PO TABS: ORAL_TABLET | ORAL | 0 refills | Status: AC

## 2021-08-22 MED ORDER — PROMETHAZINE HCL 25 MG/ML IJ SOLN: 6.2500 mg | INTRAMUSCULAR | Status: DC | PRN

## 2021-08-22 MED ORDER — PROPOFOL 500 MG/50ML IV EMUL
INTRAVENOUS | Status: DC | PRN
  Administered 2021-08-22: 75 ug/kg/min via INTRAVENOUS

## 2021-08-22 MED ORDER — OXYCODONE HCL 5 MG/5ML PO SOLN: 5.0000 mg | Freq: Once | ORAL | Status: DC | PRN

## 2021-08-22 MED ORDER — CEFAZOLIN SODIUM-DEXTROSE 2-4 GM/100ML-% IV SOLN
INTRAVENOUS | Status: AC
  Filled 2021-08-22: qty 100

## 2021-08-22 MED ORDER — LACTATED RINGERS IV SOLN: INTRAVENOUS | Status: DC

## 2021-08-22 MED ORDER — ACETAMINOPHEN 325 MG PO TABS: 325.0000 mg | ORAL_TABLET | ORAL | Status: DC | PRN

## 2021-08-22 MED ORDER — ONDANSETRON HCL 4 MG/2ML IJ SOLN
INTRAMUSCULAR | Status: DC | PRN
  Administered 2021-08-22: 4 mg via INTRAVENOUS

## 2021-08-22 MED ORDER — LIDOCAINE HCL (PF) 0.5 % IJ SOLN
INTRAMUSCULAR | Status: DC | PRN
  Administered 2021-08-22: 30 mL via INTRAVENOUS

## 2021-08-22 SURGICAL SUPPLY — 35 items
APL PRP STRL LF DISP 70% ISPRP (MISCELLANEOUS) ×1
BLADE SURG 15 STRL LF DISP TIS (BLADE) ×2 IMPLANT
BLADE SURG 15 STRL SS (BLADE) ×6
BNDG CMPR 9X4 STRL LF SNTH (GAUZE/BANDAGES/DRESSINGS) ×1
BNDG ELASTIC 3X5.8 VLCR STR LF (GAUZE/BANDAGES/DRESSINGS) ×3 IMPLANT
BNDG ESMARK 4X9 LF (GAUZE/BANDAGES/DRESSINGS) ×3 IMPLANT
BNDG GAUZE ELAST 4 BULKY (GAUZE/BANDAGES/DRESSINGS) ×3 IMPLANT
CHLORAPREP W/TINT 26 (MISCELLANEOUS) ×3 IMPLANT
CORD BIPOLAR FORCEPS 12FT (ELECTRODE) ×3 IMPLANT
COVER BACK TABLE 60X90IN (DRAPES) ×3 IMPLANT
COVER MAYO STAND STRL (DRAPES) ×3 IMPLANT
CUFF TOURN SGL QUICK 18X4 (TOURNIQUET CUFF) ×3 IMPLANT
DRAPE EXTREMITY T 121X128X90 (DISPOSABLE) ×3 IMPLANT
DRAPE SURG 17X23 STRL (DRAPES) IMPLANT
DRSG PAD ABDOMINAL 8X10 ST (GAUZE/BANDAGES/DRESSINGS) ×3 IMPLANT
GAUZE SPONGE 4X4 12PLY STRL (GAUZE/BANDAGES/DRESSINGS) ×3 IMPLANT
GAUZE XEROFORM 1X8 LF (GAUZE/BANDAGES/DRESSINGS) ×3 IMPLANT
GLOVE SRG 8 PF TXTR STRL LF DI (GLOVE) ×1 IMPLANT
GLOVE SURG ENC MOIS LTX SZ7.5 (GLOVE) ×3 IMPLANT
GLOVE SURG UNDER POLY LF SZ8 (GLOVE) ×3
GOWN STRL REUS W/ TWL LRG LVL3 (GOWN DISPOSABLE) IMPLANT
GOWN STRL REUS W/TWL 2XL LVL3 (GOWN DISPOSABLE) ×3 IMPLANT
GOWN STRL REUS W/TWL LRG LVL3 (GOWN DISPOSABLE)
GOWN STRL REUS W/TWL XL LVL3 (GOWN DISPOSABLE) ×3 IMPLANT
NEEDLE HYPO 25X1 1.5 SAFETY (NEEDLE) ×3 IMPLANT
NS IRRIG 1000ML POUR BTL (IV SOLUTION) ×3 IMPLANT
PACK BASIN DAY SURGERY FS (CUSTOM PROCEDURE TRAY) ×3 IMPLANT
PADDING CAST ABS 4INX4YD NS (CAST SUPPLIES)
PADDING CAST ABS COTTON 4X4 ST (CAST SUPPLIES) IMPLANT
STOCKINETTE 4X48 STRL (DRAPES) ×3 IMPLANT
SUT ETHILON 4 0 PS 2 18 (SUTURE) ×3 IMPLANT
SYR BULB EAR ULCER 3OZ GRN STR (SYRINGE) ×3 IMPLANT
SYR CONTROL 10ML LL (SYRINGE) ×3 IMPLANT
TOWEL GREEN STERILE FF (TOWEL DISPOSABLE) ×6 IMPLANT
UNDERPAD 30X36 HEAVY ABSORB (UNDERPADS AND DIAPERS) ×3 IMPLANT

## 2021-08-22 NOTE — Transfer of Care (Signed)
Immediate Anesthesia Transfer of Care Note  Patient: Paul Ho  Procedure(s) Performed: CARPAL TUNNEL RELEASE (Right: Wrist)  Patient Location: PACU  Anesthesia Type:MAC and Bier block  Level of Consciousness: drowsy and patient cooperative  Airway & Oxygen Therapy: Patient Spontanous Breathing and Patient connected to face mask oxygen  Post-op Assessment: Report given to RN and Post -op Vital signs reviewed and stable  Post vital signs: Reviewed and stable  Last Vitals:  Vitals Value Taken Time  BP 122/74   Temp    Pulse 77   Resp 14   SpO2 99%     Last Pain:  Vitals:   08/22/21 1149  TempSrc: Oral  PainSc: 7       Patients Stated Pain Goal: 7 (17/71/16 5790)  Complications: No notable events documented.

## 2021-08-22 NOTE — Discharge Instructions (Addendum)

## 2021-08-22 NOTE — Anesthesia Postprocedure Evaluation (Signed)
Anesthesia Post Note  Patient: Paul Ho  Procedure(s) Performed: CARPAL TUNNEL RELEASE (Right: Wrist)     Patient location during evaluation: PACU Anesthesia Type: Bier Block Level of consciousness: awake and alert Pain management: pain level controlled Vital Signs Assessment: post-procedure vital signs reviewed and stable Respiratory status: spontaneous breathing, nonlabored ventilation, respiratory function stable and patient connected to nasal cannula oxygen Cardiovascular status: stable and blood pressure returned to baseline Postop Assessment: no apparent nausea or vomiting Anesthetic complications: no   No notable events documented.  Last Vitals:  Vitals:   08/22/21 1345 08/22/21 1415  BP: 125/65 (!) 154/82  Pulse:  71  Resp:  16  Temp:  36.5 C  SpO2: 97% 95%    Last Pain:  Vitals:   08/22/21 1415  TempSrc:   PainSc: 0-No pain                 Shelton Silvas

## 2021-08-22 NOTE — H&P (Signed)
Paul Ho is an 60 y.o. male.   Chief Complaint: carpal tunnel syndrome HPI: 60 yo male with tingling in fingers.  Positive nerve conduction studies.  He wishes to have right carpal tunnel release.  Allergies:  Allergies  Allergen Reactions   Tegretol [Carbamazepine] Rash    Past Medical History:  Diagnosis Date   Bipolar disorder current episode depressed (HCC) 2014   BPH (benign prostatic hypertrophy) with urinary retention 2013   Carpal tunnel syndrome, bilateral 2013   Diabetes mellitus (HCC) 05/11/2017   lost wt, no meds now   Obesity 05/11/2017   Osteoarthritis of knees, bilateral 05/11/2017   Raynaud's disease 2013   Schizoaffective disorder, bipolar type (HCC) 05/11/2017   Thyroid disease 2015   hypothyroidism    Past Surgical History:  Procedure Laterality Date   CARPAL TUNNEL RELEASE Left 04/28/2021   Procedure: LEFT CARPAL TUNNEL RELEASE;  Surgeon: Betha Loa, MD;  Location: [facility];  Service: Orthopedics;  Laterality: Left;  30 MIN   LEG SURGERY      Family History: Family History  Problem Relation Age of Onset   Alzheimer's disease Mother    Heart disease Father        2 MIs in past   Arthritis Father        Also followed by Dr. Kellie Simmering   Restless legs syndrome Father    Arthritis/Rheumatoid Brother        ?Adult Still Disease   Diabetes Brother     Social History:   reports that he has been smoking cigarettes. He started smoking about 46 years ago. He has been smoking an average of .5 packs per day. He has never used smokeless tobacco. He reports current alcohol use of about 2.0 standard drinks per week. He reports current drug use. Drug: Cocaine.  Medications: Medications Prior to Admission  Medication Sig Dispense Refill   hydrOXYzine (VISTARIL) 25 MG capsule Take 25 mg by mouth 2 (two) times daily.      lurasidone (LATUDA) 40 MG TABS tablet Take 40 mg by mouth daily with breakfast.     pregabalin (LYRICA) 75 MG capsule  Take 75 mg by mouth 2 (two) times daily.     tadalafil (CIALIS) 20 MG tablet Take 20 mg by mouth daily as needed for erectile dysfunction.     tamsulosin (FLOMAX) 0.4 MG CAPS capsule Take 2 capsules (0.8 mg total) daily by mouth. 30 capsule 11    No results found for this or any previous visit (from the past 48 hour(s)).  No results found.    Blood pressure 116/82, pulse 84, temperature 98.5 F (36.9 C), temperature source Oral, resp. rate 12, height 5\' 9"  (1.753 m), weight 90.8 kg, SpO2 100 %.  General appearance: alert, cooperative, and appears stated age Head: Normocephalic, without obvious abnormality, atraumatic Neck: supple, symmetrical, trachea midline Cardio: regular rate and rhythm Resp: clear to auscultation bilaterally Extremities: Intact sensation and capillary refill all digits.  +epl/fpl/io.  No wounds.  Pulses: 2+ and symmetric Skin: Skin color, texture, turgor normal. No rashes or lesions Neurologic: Grossly normal Incision/Wound: none  Assessment/Plan Right carpal tunnel syndrome.  Non operative and operative treatment options have been discussed with the patient and patient wishes to proceed with operative treatment. Risks, benefits, and alternatives of surgery have been discussed and the patient agrees with the plan of care.   08/22/2021, 12:38 PM

## 2021-08-22 NOTE — Op Note (Signed)
08/22/2021 [facility]                              OPERATIVE REPORT   PREOPERATIVE DIAGNOSIS:  Right carpal tunnel syndrome.  POSTOPERATIVE DIAGNOSIS:  Right carpal tunnel syndrome.  PROCEDURE:  Right carpal tunnel release.  SURGEON:  Betha Loa, MD  ASSISTANT:  none.  ANESTHESIA: Bier block with sedation  IV FLUIDS:  Per anesthesia flow sheet.  ESTIMATED BLOOD LOSS:  Minimal.  COMPLICATIONS:  None.  SPECIMENS:  None.  TOURNIQUET TIME:    Total Tourniquet Time Documented: Forearm (Right) - 28 minutes Total: Forearm (Right) - 28 minutes   DISPOSITION:  Stable to PACU.  LOCATION: [facility]  INDICATIONS:  60 yo male with right carpal tunnel syndrome.  Positive nerve conduction studies.   He wishes to have a carpal tunnel release for management of his symptoms.  Risks, benefits and alternatives of surgery were discussed including the risk of blood loss; infection; damage to nerves, vessels, tendons, ligaments, bone; failure of surgery; need for additional surgery; complications with wound healing; continued pain; recurrence of carpal tunnel syndrome; and damage to motor branch. He voiced understanding of these risks and elected to proceed.   OPERATIVE COURSE:  After being identified preoperatively by myself, the patient and I agreed upon the procedure and site of procedure.  The surgical site was marked.  Surgical consent had been signed.  He was given IV Ancef as preoperative antibiotic prophylaxis.  He was transferred to the operating room and placed on the operating room table in supine position with the Right upper extremity on an armboard.  Bier block anesthesia was induced by the anesthesiologist.  Right upper extremity was prepped and draped in normal sterile orthopaedic fashion.  A surgical pause was performed between the surgeons, anesthesia, and operating room staff, and all were in agreement as to the patient, procedure, and site of  procedure.  Tourniquet at the proximal aspect of the forearm had been inflated for the Bier block  Incision was made over the transverse carpal ligament and carried into the subcutaneous tissues by spreading technique.  Bipolar electrocautery was used to obtain hemostasis.  The palmar fascia was sharply incised.  The transverse carpal ligament was identified and sharply incised.  It was incised distally first.  The flexor tendons were identified.  The flexor tendon to the little finger was identified and retracted radially.  The transverse carpal ligament was then incised proximally.  Scissors were used to split the distal aspect of the volar antebrachial fascia.  A finger was placed into the wound to ensure complete decompression, which was the case.  The nerve was examined.  It was flattened and hyperemic, there was an hourglass deformity, and it was adherent to the radial leaflet.  The motor branch was identified and was intact.  The wound was copiously irrigated with sterile saline.  It was then closed with 4-0 nylon in a horizontal mattress fashion.  It was injected with 0.25% plain Marcaine to aid in postoperative analgesia.  It was dressed with sterile Xeroform, 4x4s, an ABD, and wrapped with Kerlix and an Ace bandage.  Tourniquet was deflated at 28 minutes.  Fingertips were pink with brisk capillary refill after deflation of the tourniquet.  Operative drapes were broken down.  The patient was awoken from anesthesia safely.  He was transferred back to stretcher and taken to the PACU in stable condition.  I will see him back in the office in 1 week for postoperative followup.  I will give him a prescription for Percocet 5/325 1-2 tabs PO q6 hours prn pain, dispense # 20.    Betha Loa, MD Electronically signed, 08/22/21

## 2021-08-22 NOTE — Anesthesia Preprocedure Evaluation (Addendum)
Anesthesia Evaluation  Patient identified by MRN, date of birth, ID band Patient awake    Reviewed: Allergy & Precautions, NPO status , Patient's Chart, lab work & pertinent test results  Airway Mallampati: II  TM Distance: >3 FB Neck ROM: Full    Dental  (+) Teeth Intact, Dental Advisory Given   Pulmonary Current Smoker,    breath sounds clear to auscultation       Cardiovascular negative cardio ROS   Rhythm:Regular Rate:Normal     Neuro/Psych PSYCHIATRIC DISORDERS Depression Bipolar Disorder Schizophrenia    GI/Hepatic negative GI ROS, Neg liver ROS,   Endo/Other  diabetes  Renal/GU      Musculoskeletal  (+) Arthritis ,   Abdominal Normal abdominal exam  (+)   Peds  Hematology negative hematology ROS (+)   Anesthesia Other Findings   Reproductive/Obstetrics                            Anesthesia Physical Anesthesia Plan  ASA: 2  Anesthesia Plan: Bier Block and Bier Block-LIDOCAINE ONLY   Post-op Pain Management:    Induction: Intravenous  PONV Risk Score and Plan: 1 and Ondansetron, Propofol infusion and Midazolam  Airway Management Planned: Natural Airway and Simple Face Mask  Additional Equipment: None  Intra-op Plan:   Post-operative Plan:   Informed Consent: I have reviewed the patients History and Physical, chart, labs and discussed the procedure including the risks, benefits and alternatives for the proposed anesthesia with the patient or authorized representative who has indicated his/her understanding and acceptance.       Plan Discussed with: CRNA  Anesthesia Plan Comments:        Anesthesia Quick Evaluation

## 2021-08-22 NOTE — Anesthesia Procedure Notes (Signed)
Anesthesia Regional Block: Bier block (IV Regional)   Pre-Anesthetic Checklist: , timeout performed,  Correct Patient, Correct Site, Correct Laterality,  Correct Procedure, Correct Position, site marked,  Risks and benefits discussed,  Pre-op evaluation,  At surgeon's request  Laterality: Right  Prep: alcohol swabs       Needles:  Injection technique: Single-shot       Needle Gauge: 22     Additional Needles:   Procedures:,,,,, intact distal pulses, Esmarch exsanguination,  Single tourniquet utilized    Narrative:  Start time: 08/22/2021 12:52 PM End time: 08/22/2021 12:53 PM  Performed by: Personally  CRNA: Pearson Grippe, CRNA

## 2021-08-25 ENCOUNTER — Encounter (HOSPITAL_BASED_OUTPATIENT_CLINIC_OR_DEPARTMENT_OTHER): Payer: Self-pay | Admitting: Orthopedic Surgery

## 2021-11-22 ENCOUNTER — Other Ambulatory Visit: Payer: Self-pay

## 2021-11-22 ENCOUNTER — Encounter (HOSPITAL_BASED_OUTPATIENT_CLINIC_OR_DEPARTMENT_OTHER): Payer: Self-pay | Admitting: *Deleted

## 2021-11-22 DIAGNOSIS — Z5321 Procedure and treatment not carried out due to patient leaving prior to being seen by health care provider: Secondary | ICD-10-CM | POA: Insufficient documentation

## 2021-11-22 DIAGNOSIS — K6289 Other specified diseases of anus and rectum: Secondary | ICD-10-CM | POA: Diagnosis present

## 2021-11-22 DIAGNOSIS — K921 Melena: Secondary | ICD-10-CM | POA: Diagnosis not present

## 2021-11-22 NOTE — ED Triage Notes (Signed)
Pt reports dark blood with BM x 2 weeks. Also reports he has lost "60 pounds" since October without changing his diet. C/o rectal pain also

## 2021-11-23 ENCOUNTER — Emergency Department (HOSPITAL_BASED_OUTPATIENT_CLINIC_OR_DEPARTMENT_OTHER)
Admission: EM | Admit: 2021-11-23 | Discharge: 2021-11-23 | Disposition: A | Discharge status: Home / Self Care | Payer: Medicaid Other | Attending: Emergency Medicine | Admitting: Emergency Medicine

## 2021-11-23 LAB — COMPREHENSIVE METABOLIC PANEL
ALT: 16 U/L (ref 0–44)
AST: 16 U/L (ref 15–41)
Albumin: 3.6 g/dL (ref 3.5–5.0)
Alkaline Phosphatase: 75 U/L (ref 38–126)
Anion gap: 5 (ref 5–15)
BUN: 12 mg/dL (ref 8–23)
CO2: 25 mmol/L (ref 22–32)
Calcium: 9 mg/dL (ref 8.9–10.3)
Chloride: 109 mmol/L (ref 98–111)
Creatinine, Ser: 0.95 mg/dL (ref 0.61–1.24)
GFR, Estimated: >60 mL/min (ref >60)
Glucose, Bld: 113 mg/dL — ABNORMAL HIGH (ref 70–99)
Potassium: 3.3 mmol/L — ABNORMAL LOW (ref 3.5–5.1)
Sodium: 139 mmol/L (ref 135–145)
Total Bilirubin: 0.3 mg/dL (ref 0.3–1.2)
Total Protein: 7.1 g/dL (ref 6.5–8.1)

## 2021-11-23 LAB — CBC
HCT: 40.4 % (ref 39.0–52.0)
Hemoglobin: 13.5 g/dL (ref 13.0–17.0)
MCH: 26.5 pg (ref 26.0–34.0)
MCHC: 33.4 g/dL (ref 30.0–36.0)
MCV: 79.4 fL — ABNORMAL LOW (ref 80.0–100.0)
Platelets: 482 10*3/uL — ABNORMAL HIGH (ref 150–400)
RBC: 5.09 MIL/uL (ref 4.22–5.81)
RDW: 15.6 % — ABNORMAL HIGH (ref 11.5–15.5)
WBC: 10.8 10*3/uL — ABNORMAL HIGH (ref 4.0–10.5)
nRBC: 0 % (ref 0.0–0.2)

## 2021-11-23 NOTE — ED Notes (Signed)
Pt pacing in room. Spouse asking for wait time. CN apologized for wait time and encouraged pt to wait to be seen. Pt and spouse seen leaving shortly after.

## 2024-01-02 ENCOUNTER — Ambulatory Visit
Admission: EM | Admit: 2024-01-02 | Discharge: 2024-01-02 | Disposition: A | Discharge status: Home / Self Care | Payer: MEDICAID | Attending: Family Medicine | Admitting: Family Medicine

## 2024-01-02 DIAGNOSIS — M5030 Other cervical disc degeneration, unspecified cervical region: Secondary | ICD-10-CM | POA: Diagnosis not present

## 2024-01-02 DIAGNOSIS — M2550 Pain in unspecified joint: Secondary | ICD-10-CM | POA: Diagnosis not present

## 2024-01-02 DIAGNOSIS — H6122 Impacted cerumen, left ear: Secondary | ICD-10-CM

## 2024-01-02 LAB — POCT FASTING CBG KUC MANUAL ENTRY: POCT Glucose (KUC): 121 mg/dL — AB (ref 70–99)

## 2024-01-02 MED ORDER — PREDNISONE 10 MG PO TABS
30.0000 mg | ORAL_TABLET | Freq: Every day | ORAL | 0 refills | Status: AC
Start: 2024-01-02 — End: ?

## 2024-01-02 NOTE — ED Provider Notes (Signed)
 [facility]  Note:  This document was prepared using Conservation officer, historic buildings and may include unintentional dictation errors.  MRN: [id number] DOB: [birth year]  Subjective:   Paul Ho is a 63 y.o. male presenting for 2 primary concerns.  1.  Reports 2-week history of persistent left ear discomfort, decreased hearing.  Denies fever, drainage, tinnitus, sinus symptoms. 2.  Reports 3-day history of multiple joint pain including his hands, left shoulder and neck.  Has a history of osteoarthritis in multiple joints.  There is documented history of degenerative disc disease of the cervical region.  No history of gout.  Diabetes is well-controlled.  Does not take insulin.  No history of congestive heart failure.  Has previously done well with steroids.  No current facility-administered medications for this encounter.  Current Outpatient Medications:    hydrOXYzine (VISTARIL) 25 MG capsule, Take 25 mg by mouth 2 (two) times daily. , Disp: , Rfl:    lurasidone (LATUDA) 40 MG TABS tablet, Take 40 mg by mouth daily with breakfast., Disp: , Rfl:    pregabalin (LYRICA) 75 MG capsule, Take 75 mg by mouth 2 (two) times daily., Disp: , Rfl:    tadalafil (CIALIS) 20 MG tablet, Take 20 mg by mouth daily as needed for erectile dysfunction., Disp: , Rfl:    oxyCODONE-acetaminophen (PERCOCET) 5-325 MG tablet, 1-2 tabs po q6 hours prn pain, Disp: 20 tablet, Rfl: 0   tamsulosin (FLOMAX) 0.4 MG CAPS capsule, Take 2 capsules (0.8 mg total) daily by mouth., Disp: 30 capsule, Rfl: 11   Allergies  Allergen Reactions   Tegretol [Carbamazepine] Rash    Past Medical History:  Diagnosis Date   Bipolar disorder current episode depressed (HCC) 2014   BPH (benign prostatic hypertrophy) with urinary retention 2013   Carpal tunnel syndrome, bilateral 2013   Diabetes mellitus (HCC) 05/11/2017   lost wt, no meds now   Obesity 05/11/2017   Osteoarthritis of knees, bilateral  05/11/2017   Raynaud's disease 2013   Schizoaffective disorder, bipolar type (HCC) 05/11/2017   Thyroid disease 2015   hypothyroidism     Past Surgical History:  Procedure Laterality Date   CARPAL TUNNEL RELEASE Left 04/28/2021   Procedure: LEFT CARPAL TUNNEL RELEASE;  Surgeon: Betha Loa, MD;  Location: [facility];  Service: Orthopedics;  Laterality: Left;  30 MIN   CARPAL TUNNEL RELEASE Right 08/22/2021   Procedure: CARPAL TUNNEL RELEASE;  Surgeon: Betha Loa, MD;  Location: [facility];  Service: Orthopedics;  Laterality: Right;  right carpal tunnel release   LEG SURGERY      Family History  Problem Relation Age of Onset   Alzheimer's disease Mother    Heart disease Father        2 MIs in past   Arthritis Father        Also followed by Dr. Kellie Simmering   Restless legs syndrome Father    Arthritis/Rheumatoid Brother        ?Adult Still Disease   Diabetes Brother     Social History   Tobacco Use   Smoking status: Every Day    Current packs/day: 0.50    Average packs/day: 0.5 packs/day for 49.2 years (24.6 ttl pk-yrs)    Types: Cigarettes    Start date: 10/05/1974   Smokeless tobacco: Never  Substance Use Topics   Alcohol use: Yes    Alcohol/week: 2.0 standard drinks of alcohol    Types: 2 Standard drinks or equivalent per week  Comment: 2 x week   Drug use: Yes    Types: Marijuana    Comment: History of Cocaine abuse with snorting and smoking.  No use for 3 years.  This was the cause of  his incarceration.    ROS   Objective:   Vitals: BP (!) 147/78 (BP Location: Left Arm)   Pulse 96   Temp 98 F (36.7 C) (Oral)   Resp 18   SpO2 96%   Physical Exam Constitutional:      General: He is not in acute distress.    Appearance: Normal appearance. He is well-developed and normal weight. He is not ill-appearing, toxic-appearing or diaphoretic.  HENT:     Head: Normocephalic and atraumatic.     Right Ear: Tympanic membrane, ear  canal and external ear normal. There is no impacted cerumen.     Left Ear: Tympanic membrane, ear canal and external ear normal. There is impacted cerumen.     Nose: Nose normal.     Mouth/Throat:     Pharynx: Oropharynx is clear.  Eyes:     General: No scleral icterus.       Right eye: No discharge.        Left eye: No discharge.     Extraocular Movements: Extraocular movements intact.  Cardiovascular:     Rate and Rhythm: Normal rate.  Pulmonary:     Effort: Pulmonary effort is normal.  Musculoskeletal:     Cervical back: Normal range of motion.     Comments: Minimal tenderness about dorsal aspect of both hands. No swelling, warmth, erythema. Decreased ROM above 90 degrees for the right shoulder. No tenderness at the shoulder joint.  Slight tenderness of the paraspinal muscles of the cervical region bilaterally extending to the trapezius muscles as well.  Neurological:     Mental Status: He is alert and oriented to person, place, and time.  Psychiatric:        Mood and Affect: Mood normal.        Behavior: Behavior normal.        Thought Content: Thought content normal.        Judgment: Judgment normal.     Results for orders placed or performed during the hospital encounter of 01/02/24 (from the past 24 hours)  POCT CBG (manual entry)     Status: Abnormal   Collection Time: 01/02/24  8:32 AM  Result Value Ref Range   POCT Glucose ([facility]) 121 (A) 70 - 99 mg/dL   Ear lavage performed using mixture of peroxide and water.  Pressure irrigation performed using a bottle and a thin ear tube.  Left ear lavage.  No curette was used.   Assessment and Plan :   PDMP not reviewed this encounter.  1. Impacted cerumen of left ear   2. Multiple joint pain   3. Degenerative disc disease, cervical    Recommended an oral prednisone course at 30 mg for 5 days for what I suspect is arthritic joint pain.  Successful left ear lavage.  General management of cerumen impaction reviewed with patient.   Anticipatory guidance provided. Counseled patient on potential for adverse effects with medications prescribed/recommended today, ER and return-to-clinic precautions discussed, patient verbalized understanding.    Wallis Bamberg, [place] 01/02/24 [phone]

## 2024-01-02 NOTE — ED Triage Notes (Signed)
 Denies any recent injuries or trauma to his arm.

## 2024-01-02 NOTE — ED Triage Notes (Signed)
 Patient presents to the office for left ear pain x 2 weeks.  Patient reports right arm pain and swelling in his head x 3 days.
